# Patient Record
Sex: Male | Born: 1960 | Race: White | Hispanic: No | Marital: Married | Smoking: Former smoker
Health system: Southern US, Community
[De-identification: ages and names within clinical notes are randomized; demographics above are authoritative.]

## PROBLEM LIST (undated history)

## (undated) DIAGNOSIS — I4892 Unspecified atrial flutter: Secondary | ICD-10-CM

## (undated) HISTORY — PX: SHOULDER SURGERY: SHX246

## (undated) HISTORY — DX: Unspecified atrial flutter: I48.92

## (undated) HISTORY — DX: Morbid (severe) obesity due to excess calories: E66.01

## (undated) HISTORY — PX: KNEE SURGERY: SHX244

## (undated) HISTORY — PX: TONSILLECTOMY: SUR1361

---

## 2006-02-14 ENCOUNTER — Encounter: Admission: RE | Admit: 2006-02-14 | Discharge: 2006-02-14 | Payer: Self-pay | Admitting: Rheumatology

## 2007-10-03 ENCOUNTER — Emergency Department (HOSPITAL_BASED_OUTPATIENT_CLINIC_OR_DEPARTMENT_OTHER): Admission: EM | Admit: 2007-10-03 | Discharge: 2007-10-03 | Payer: Self-pay | Admitting: Emergency Medicine

## 2015-02-26 DIAGNOSIS — I4892 Unspecified atrial flutter: Secondary | ICD-10-CM

## 2015-02-26 HISTORY — DX: Unspecified atrial flutter: I48.92

## 2015-09-18 ENCOUNTER — Emergency Department (HOSPITAL_COMMUNITY): Payer: BC Managed Care – PPO

## 2015-09-18 ENCOUNTER — Other Ambulatory Visit: Payer: Self-pay

## 2015-09-18 ENCOUNTER — Encounter (HOSPITAL_COMMUNITY): Payer: Self-pay

## 2015-09-18 ENCOUNTER — Emergency Department (HOSPITAL_COMMUNITY)
Admission: EM | Admit: 2015-09-18 | Discharge: 2015-09-18 | Disposition: A | Discharge status: Home / Self Care | Payer: BC Managed Care – PPO | Attending: Emergency Medicine | Admitting: Emergency Medicine

## 2015-09-18 DIAGNOSIS — Z7901 Long term (current) use of anticoagulants: Secondary | ICD-10-CM | POA: Insufficient documentation

## 2015-09-18 DIAGNOSIS — Z87891 Personal history of nicotine dependence: Secondary | ICD-10-CM | POA: Diagnosis not present

## 2015-09-18 DIAGNOSIS — R009 Unspecified abnormalities of heart beat: Secondary | ICD-10-CM | POA: Diagnosis present

## 2015-09-18 DIAGNOSIS — I4891 Unspecified atrial fibrillation: Secondary | ICD-10-CM | POA: Diagnosis not present

## 2015-09-18 LAB — CBC WITH DIFFERENTIAL/PLATELET
BASOS ABS: 0.1 10*3/uL (ref 0.0–0.1)
BASOS PCT: 1 %
Eosinophils Absolute: 0.2 10*3/uL (ref 0.0–0.7)
Eosinophils Relative: 3 %
HEMATOCRIT: 51.7 % (ref 39.0–52.0)
Hemoglobin: 17.8 g/dL — ABNORMAL HIGH (ref 13.0–17.0)
LYMPHS PCT: 23 %
Lymphs Abs: 2.2 10*3/uL (ref 0.7–4.0)
MCH: 32.6 pg (ref 26.0–34.0)
MCHC: 34.4 g/dL (ref 30.0–36.0)
MCV: 94.7 fL (ref 78.0–100.0)
Monocytes Absolute: 1 10*3/uL (ref 0.1–1.0)
Monocytes Relative: 11 %
NEUTROS ABS: 5.8 10*3/uL (ref 1.7–7.7)
NEUTROS PCT: 62 %
Platelets: 252 10*3/uL (ref 150–400)
RBC: 5.46 MIL/uL (ref 4.22–5.81)
RDW: 13.5 % (ref 11.5–15.5)
WBC: 9.3 10*3/uL (ref 4.0–10.5)

## 2015-09-18 LAB — BASIC METABOLIC PANEL
ANION GAP: 6 (ref 5–15)
BUN: 15 mg/dL (ref 6–20)
CALCIUM: 8.7 mg/dL — AB (ref 8.9–10.3)
CO2: 23 mmol/L (ref 22–32)
Chloride: 110 mmol/L (ref 101–111)
Creatinine, Ser: 1.11 mg/dL (ref 0.61–1.24)
GLUCOSE: 86 mg/dL (ref 65–99)
POTASSIUM: 3.9 mmol/L (ref 3.5–5.1)
SODIUM: 139 mmol/L (ref 135–145)

## 2015-09-18 LAB — BRAIN NATRIURETIC PEPTIDE: B Natriuretic Peptide: 100.2 pg/mL — ABNORMAL HIGH (ref 0.0–100.0)

## 2015-09-18 LAB — I-STAT TROPONIN, ED: TROPONIN I, POC: 0 ng/mL (ref 0.00–0.08)

## 2015-09-18 MED ORDER — RIVAROXABAN 20 MG PO TABS: 20.0000 mg | ORAL_TABLET | Freq: Every day | ORAL | 0 refills | Status: DC

## 2015-09-18 MED ORDER — METOPROLOL SUCCINATE ER 25 MG PO TB24: 25.0000 mg | ORAL_TABLET | Freq: Every day | ORAL | 0 refills | Status: DC

## 2015-09-18 MED ORDER — DILTIAZEM HCL 25 MG/5ML IV SOLN
10.0000 mg | Freq: Once | INTRAVENOUS | Status: AC
  Administered 2015-09-18: 10 mg via INTRAVENOUS
  Filled 2015-09-18: qty 5

## 2015-09-18 MED ORDER — METOPROLOL SUCCINATE ER 25 MG PO TB24
25.0000 mg | ORAL_TABLET | Freq: Every day | ORAL | Status: DC
  Administered 2015-09-18: 25 mg via ORAL
  Filled 2015-09-18: qty 1

## 2015-09-18 MED ORDER — RIVAROXABAN 15 MG PO TABS
15.0000 mg | ORAL_TABLET | Freq: Two times a day (BID) | ORAL | Status: DC
  Filled 2015-09-18 (×2): qty 1

## 2015-09-18 MED ORDER — RIVAROXABAN 20 MG PO TABS
20.0000 mg | ORAL_TABLET | Freq: Once | ORAL | Status: AC
  Administered 2015-09-18: 20 mg via ORAL
  Filled 2015-09-18: qty 1

## 2015-09-18 NOTE — ED Provider Notes (Signed)
[facility]-EMERGENCY DEPT Provider Note   CSN: [id number] Arrival date & time: 09/18/15  1735  First Provider Contact:  None       History   Chief Complaint Chief Complaint  Patient presents with  . Irregular Heart Beat  . Chest Pain    HPI Marvin Simmons is a 55 y.o. male.  55 year old male who presents with heart palpitations. The patient reports that for the past 1-2 months, he has had intermittent heart palpitations that usually occur in the evening when he is trying to go to sleep. He assumed that it was related to anxiety. The episodes seem to be increasing in frequency. Today around 12:00, he didn't having heart palpitations and they were sustained. He looked at his apple watch which showed that his heart rate had been running in the 160s. He contacted his PCP and was sent here by EMS. He denies any associated chest pain, shortness of breath, nausea, or vomiting. He has had some throat tightness and diaphoresis associated with the episodes. No fevers or recent illness. No history of heart problems. He does note a family history of mother with emphysema and atrial fibrillation, recent ablation.   The history is provided by the patient.  Chest Pain      History reviewed. No pertinent past medical history.  There are no active problems to display for this patient.   Past Surgical History:  Procedure Laterality Date  . SHOULDER SURGERY Right   . TONSILLECTOMY         Home Medications    Prior to Admission medications   Medication Sig Start Date End Date Taking? Authorizing Provider  albuterol (PROAIR HFA) 108 (90 Base) MCG/ACT inhaler Inhale 1-2 puffs into the lungs every 6 (six) hours as needed for shortness of breath.  05/24/15  Yes Historical Provider, MD  mometasone (NASONEX) 50 MCG/ACT nasal spray Place 1 spray into the nose daily as needed.   Yes Historical Provider, MD  sildenafil (VIAGRA) 50 MG tablet Take 50 mg by mouth daily as needed.   Yes Historical  Provider, MD  testosterone cypionate (DEPOTESTOSTERONE CYPIONATE) 200 MG/ML injection Inject 200 mg into the muscle every 14 (fourteen) days. 09/16/15  Yes Historical Provider, MD  metoprolol succinate (TOPROL-XL) 25 MG 24 hr tablet Take 1 tablet (25 mg total) by mouth daily. May increase to 2 tablets (  total) by mouth daily if your heart rate continues to be greater than 100 09/18/15   Laurence Spates, MD  rivaroxaban (XARELTO) 20 MG TABS tablet Take 1 tablet (20 mg total) by mouth daily with supper. 09/18/15   Laurence Spates, MD    Family History No family history on file.  Social History Social History  Substance Use Topics  . Smoking status: Former Smoker    Quit date: 09/18/1990  . Smokeless tobacco: Former Neurosurgeon    Quit date: 09/18/1990  . Alcohol use Yes     Comment: avg 4oz alcohol/day     Allergies   Review of patient's allergies indicates no known allergies.   Review of Systems Review of Systems  Cardiovascular: Positive for chest pain.   10 Systems reviewed and are negative for acute change except as noted in the HPI.   Physical Exam Updated Vital Signs BP 118/72   Pulse 106   Temp 98.2 F (36.8 C) (Oral)   Resp 17   SpO2 (!) 75%   Physical Exam  Constitutional: He is oriented to person, place, and time. He appears well-developed  and well-nourished. No distress.  HENT:  Head: Normocephalic and atraumatic.  Moist mucous membranes  Eyes: Conjunctivae are normal. Pupils are equal, round, and reactive to light.  Neck: Neck supple.  Cardiovascular: Normal heart sounds.  An irregularly irregular rhythm present. Tachycardia present.   No murmur heard. Pulmonary/Chest: Effort normal and breath sounds normal.  Abdominal: Soft. Bowel sounds are normal. He exhibits no distension. There is no tenderness.  Musculoskeletal: He exhibits no edema.  Neurological: He is alert and oriented to person, place, and time.  Fluent speech  Skin: Skin is warm and dry.    Psychiatric: He has a normal mood and affect. Judgment normal.  Nursing note and vitals reviewed.    ED Treatments / Results  Labs (all labs ordered are listed, but only abnormal results are displayed) Labs Reviewed  BASIC METABOLIC PANEL - Abnormal; Notable for the following:       Result Value   Calcium 8.7 (*)    All other components within normal limits  CBC WITH DIFFERENTIAL/PLATELET - Abnormal; Notable for the following:    Hemoglobin 17.8 (*)    All other components within normal limits  BRAIN NATRIURETIC PEPTIDE - Abnormal; Notable for the following:    B Natriuretic Peptide 100.2 (*)    All other components within normal limits  I-STAT TROPOININ, ED    EKG  EKG Interpretation  Date/Time:  Monday September 18 2015 18:06:04 EDT Ventricular Rate:  128 PR Interval:    QRS Duration: 80 QT Interval:  342 QTC Calculation: 469 R Axis:   58 Text Interpretation:  Atrial fibrillation Ventricular bigeminy Anteroseptal infarct, age indeterminate ST elevation, consider inferior injury similar to previous EKG A fib with RVR Confirmed by LITTLE MD, RACHEL [phone]) on 09/18/2015 7:04:20 PM       Radiology Dg Chest 2 View  Result Date: 09/18/2015 CLINICAL DATA:  Pt had palpitations starting around 1200 today. Has had similar episodes when trying to go to sleep x 1-2 months. Denies shortness of breath, n/v, dizziness. Does report having tightness in lower throat region EXAM: CHEST  2 VIEW COMPARISON:  02/14/2006 FINDINGS: The heart size and mediastinal contours are within normal limits. Both lungs are clear. No pleural effusion or pneumothorax. The visualized skeletal structures are unremarkable. IMPRESSION: No active cardiopulmonary disease. Electronically Signed   By: Amie Portland M.D.   On: 09/18/2015 19:50   Procedures Procedures (including critical care time)  Medications Ordered in ED Medications  metoprolol succinate (TOPROL-XL) 24 hr tablet 25 mg (25 mg Oral Given 09/18/15  2239)  diltiazem (CARDIZEM) injection 10 mg (10 mg Intravenous Given 09/18/15 1850)  rivaroxaban (XARELTO) tablet 20 mg (20 mg Oral Given 09/18/15 2240)     Initial Impression / Assessment and Plan / ED Course  I have reviewed the triage vital signs and the nursing notes.  Pertinent labs & imaging results that were available during my care of the patient were reviewed by me and considered in my medical decision making (see chart for details).  Clinical Course  Comment By Time  Labs including troponin normal. BNP 100. CXR negative for acute process. Laurence Spates, MD 07/24 2033   Pt with no significant medical history presents with palpitations that began around noon today, patient noted he was tachycardic on his watch heart monitor. On arrival, he was awake and alert, in no acute distress. Blood pressure stable, heart rate variable from 80s to 140s. EKG shows atrial fibrillation/atrial flutter. Patient received aspirin by EMS. Obtained  above lab work as well as chest x-ray. Gave the patient 10 mg IV diltiazem.  Labwork including BNP and troponin was normal. Chest x-ray negative. After receiving single dose of diltiazem, the patient's rate improved to less than 100. I discussed the patient's new diagnosis of atrial fibrillation with cardiology, Dr. Sullivan Lone, who felt that the patient was appropriate for discharge on rate control and anticoagulation. Although his CHADS VASC score is 0, she recommended Xarelto as the patient may be eligible for future cardioversion. Per her recs, I also gave Rx for metoprolol XL 25mg  with structures to increase to 50 mg if he continues to have tachycardia. Patient well-appearing without complaints after several hours in the ED. ED pharmacist reviewed Xarelto instructions with patient. Patient provided with follow-up information to be seen in atrial fibrillation clinic in 48 hours. Return precautions reviewed. Patient voiced understanding and was discharged in  satisfactory condition.  Final Clinical Impressions(s) / ED Diagnoses   Final diagnoses:  Atrial fibrillation with RVR [facility])    New Prescriptions Discharge Medication List as of 09/18/2015 10:29 PM    START taking these medications   Details  metoprolol succinate (TOPROL-XL) 25 MG 24 hr tablet Take 1 tablet (25 mg total) by mouth daily. May increase to 2 tablets (50mg  total) by mouth daily if your heart rate continues to be greater than 100, Starting Mon 09/18/2015, Print    rivaroxaban (XARELTO) 20 MG TABS tablet Take 1 tablet (20 mg total) by mouth daily with supper., Starting Mon 09/18/2015, Print         Laurence Spates, MD 09/19/15 0111

## 2015-09-18 NOTE — Discharge Instructions (Addendum)

## 2015-09-18 NOTE — ED Notes (Signed)
Pt to xray

## 2015-09-18 NOTE — ED Notes (Signed)
Pt. Pulse in 90's and pressure 121/81. MD notified, cardizem administered

## 2015-09-18 NOTE — ED Triage Notes (Signed)
Per EMS - pt from PCP. Pt had palpitations starting around 1200 today. Has had similar episodes when trying to go to sleep x 1-2 months. SBP 160, 96% RA, hr 80-140bpm. Denies shortness of breath, n/v, dizziness. Does report having tightness in lower throat region. Given 324mg  aspirin.

## 2015-09-19 ENCOUNTER — Other Ambulatory Visit: Payer: Self-pay

## 2015-09-19 ENCOUNTER — Encounter (HOSPITAL_COMMUNITY): Payer: Self-pay | Admitting: Nurse Practitioner

## 2015-09-19 ENCOUNTER — Ambulatory Visit (HOSPITAL_COMMUNITY)
Admission: RE | Admit: 2015-09-19 | Discharge: 2015-09-19 | Disposition: A | Discharge status: Home / Self Care | Payer: BC Managed Care – PPO | Source: Ambulatory Visit | Attending: Nurse Practitioner | Admitting: Nurse Practitioner

## 2015-09-19 VITALS — BP 132/76 | HR 134 | Ht 66.75 in | Wt 236.0 lb

## 2015-09-19 DIAGNOSIS — Z79899 Other long term (current) drug therapy: Secondary | ICD-10-CM | POA: Insufficient documentation

## 2015-09-19 DIAGNOSIS — I4891 Unspecified atrial fibrillation: Secondary | ICD-10-CM | POA: Insufficient documentation

## 2015-09-19 DIAGNOSIS — I483 Typical atrial flutter: Secondary | ICD-10-CM

## 2015-09-19 DIAGNOSIS — Z87891 Personal history of nicotine dependence: Secondary | ICD-10-CM | POA: Insufficient documentation

## 2015-09-19 DIAGNOSIS — Z7901 Long term (current) use of anticoagulants: Secondary | ICD-10-CM | POA: Diagnosis not present

## 2015-09-19 DIAGNOSIS — I4892 Unspecified atrial flutter: Secondary | ICD-10-CM | POA: Diagnosis not present

## 2015-09-19 DIAGNOSIS — R0683 Snoring: Secondary | ICD-10-CM | POA: Diagnosis not present

## 2015-09-19 MED ORDER — METOPROLOL SUCCINATE ER 25 MG PO TB24: 50.0000 mg | ORAL_TABLET | Freq: Every day | ORAL | 0 refills | Status: DC

## 2015-09-19 NOTE — Patient Instructions (Signed)
Your physician has recommended you make the following change in your medication:  1)Metoprolol 50mg once a day  

## 2015-09-20 ENCOUNTER — Encounter (HOSPITAL_COMMUNITY): Payer: Self-pay | Admitting: Nurse Practitioner

## 2015-09-20 NOTE — Progress Notes (Signed)
Patient ID: Marvin Simmons, male   DOB: 1960-09-05, 55 y.o.   MRN: [id number]     Primary Care Physician: Delorse Lek, MD Referring Physician: [facility] ER f/u   Marvin Simmons is a 55 y.o. male with h/o new onset a flutter seen in the ER last pm. He was given 25 mg toprol XL daily( has had only the one dose last pm), and was started on xarelto 20 mg with a chadsvasc score of 0. Pt continues in afib with RVR today. Tolerating fairly well.  Review of risk factors include 2 alcoholic drinks per night, probable sleep apnea, with heavy snoring reported by wife, obesity, inactivity. No tobacco and mimial caffeine.No previous echo. 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 H/o recent ablation in his 50+ yo mother in the [place] area.No other family members with afib. Denies rheumatic fever.  Today, he denies symptoms of  chest pain, shortness of breath, orthopnea, PND, lower extremity edema, dizziness, presyncope, syncope, or neurologic sequela. Positive for palps and fatigue.The patient is tolerating medications without difficulties and is otherwise without complaint today.   No past medical history on file. Past Surgical History:  Procedure Laterality Date  . SHOULDER SURGERY Right   . TONSILLECTOMY      Current Outpatient Prescriptions  Medication Sig Dispense Refill  . albuterol (PROAIR HFA) 108 (90 Base) MCG/ACT inhaler Inhale 1-2 puffs into the lungs every 6 (six) hours as needed for shortness of breath.     . metoprolol succinate (TOPROL-XL) 25 MG 24 hr tablet Take 2 tablets (50 mg total) by mouth daily. 30 tablet 0  . mometasone (NASONEX) 50 MCG/ACT nasal spray Place 1 spray into the nose daily as needed.    . rivaroxaban (XARELTO) 20 MG TABS tablet Take 1 tablet (20 mg total) by mouth daily with supper. 30 tablet 0  . sildenafil (VIAGRA) 50 MG tablet Take 50 mg by mouth daily as needed.    . testosterone cypionate (DEPOTESTOSTERONE CYPIONATE) 200 MG/ML injection  Inject 200 mg into the muscle every 14 (fourteen) days.     No current facility-administered medications for this encounter.     No Known Allergies  Social History   Social History  . Marital status: Married    Spouse name: N/A  . Number of children: N/A  . Years of education: N/A   Occupational History  . Not on file.   Social History Main Topics  . Smoking status: Former Smoker    Quit date: 09/18/1990  . Smokeless tobacco: Former Neurosurgeon    Quit date: 09/18/1990  . Alcohol use Yes     Comment: avg 4oz alcohol/day  . Drug use: No  . Sexual activity: Not on file   Other Topics Concern  . Not on file   Social History Narrative  . No narrative on file    No family history on file.  ROS- All systems are reviewed and negative except as per the HPI above  Physical Exam: Vitals:   09/19/15 1445  BP: 132/76  Pulse: (!) 134  Weight: 236 lb (107 kg)  Height: 5' 6.75" (1.695 m)    GEN- The patient is well appearing, alert and oriented x 3 today.   Head- normocephalic, atraumatic Eyes-  Sclera clear, conjunctiva pink Ears- hearing intact Oropharynx- clear Neck- supple, no JVP Lymph- no cervical lymphadenopathy Lungs- Clear to ausculation bilaterally, normal work of breathing Heart- irregular rate and rhythm, no murmurs, rubs or gallops,  PMI not laterally displaced GI- soft, NT, ND, + BS Extremities- no clubbing, cyanosis, or edema MS- no significant deformity or atrophy Skin- no rash or lesion Psych- euthymic mood, full affect Neuro- strength and sensation are intact  EKG-aflutter with v rate at 134 bpm with variable AV block ER/Epic records reviewed  Assessment and Plan: 1. New onset a flutter with RVR Increase toprol xl to 50 mg this pm Continue xarelto 20 mg for now, but with chadsvasc score of 0 probably wont require long term. Echo when v rate controlled If EKG's continue to show only aflutter, may be an aflutter ablation candidate  2. Risk factor  modification Decrease alcohol to no more that 2 drinks a week Weight lose thru increased physical activity and diet modification Weight class encouraged Sleep study for snoring and many symptoms of sleep apnea  F/u Thursday in afib clinic   Marypat Kimmet [facility] [address] [place], [place] [address] [phone]

## 2015-09-21 ENCOUNTER — Other Ambulatory Visit: Payer: Self-pay

## 2015-09-21 ENCOUNTER — Encounter (HOSPITAL_COMMUNITY): Payer: Self-pay | Admitting: Nurse Practitioner

## 2015-09-21 ENCOUNTER — Ambulatory Visit (HOSPITAL_COMMUNITY)
Admission: RE | Admit: 2015-09-21 | Discharge: 2015-09-21 | Disposition: A | Discharge status: Home / Self Care | Payer: BC Managed Care – PPO | Source: Ambulatory Visit | Attending: Nurse Practitioner | Admitting: Nurse Practitioner

## 2015-09-21 VITALS — BP 106/68 | HR 110 | Ht 66.75 in | Wt 236.2 lb

## 2015-09-21 DIAGNOSIS — Z87891 Personal history of nicotine dependence: Secondary | ICD-10-CM | POA: Insufficient documentation

## 2015-09-21 DIAGNOSIS — F1010 Alcohol abuse, uncomplicated: Secondary | ICD-10-CM | POA: Insufficient documentation

## 2015-09-21 DIAGNOSIS — R9431 Abnormal electrocardiogram [ECG] [EKG]: Secondary | ICD-10-CM | POA: Diagnosis not present

## 2015-09-21 DIAGNOSIS — I483 Typical atrial flutter: Secondary | ICD-10-CM

## 2015-09-21 DIAGNOSIS — I4892 Unspecified atrial flutter: Secondary | ICD-10-CM | POA: Diagnosis not present

## 2015-09-21 DIAGNOSIS — Z7901 Long term (current) use of anticoagulants: Secondary | ICD-10-CM | POA: Insufficient documentation

## 2015-09-21 NOTE — Progress Notes (Signed)
Patient ID: Marvin Simmons, male   DOB: 1960/06/04, 55 y.o.   MRN: [id number]       Primary Care Physician: Delorse Lek, MD Referring Physician: [facility] ER f/u   Marvin Simmons is a 55 y.o. male with h/o new onset a flutter seen in the ER last pm. He was given 25 mg toprol XL daily( has had only the one dose last pm), and was started on xarelto 20 mg with a chadsvasc score of 0. Pt continues in aflutter with RVR today. Tolerating fairly well.  Review of risk factors include 2 alcoholic drinks per night, probable sleep apnea, with heavy snoring reported by wife, obesity, inactivity. No tobacco and mimial caffeine.No previous echo. 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 H/o recent ablation in his 71+ yo mother in the [place] area.No other family members with atrial arrhythmias. Denies rheumatic fever.    Returns today with EKG showing aflutter with HR slower at 110 bpm, but BP soft  With sys around 100 and do not think that I can go up on rate control meds today. He is tolerating arrhythmia ok. He is being compliant with xarleto.  Today, he denies symptoms of  chest pain, shortness of breath, orthopnea, PND, lower extremity edema, dizziness, presyncope, syncope, or neurologic sequela. Positive for palps and fatigue.The patient is tolerating medications without difficulties and is otherwise without complaint today.   No past medical history on file. Past Surgical History:  Procedure Laterality Date  . SHOULDER SURGERY Right   . TONSILLECTOMY      Current Outpatient Prescriptions  Medication Sig Dispense Refill  . albuterol (PROAIR HFA) 108 (90 Base) MCG/ACT inhaler Inhale 1-2 puffs into the lungs every 6 (six) hours as needed for shortness of breath.     . metoprolol succinate (TOPROL-XL) 25 MG 24 hr tablet Take 2 tablets (50 mg total) by mouth daily. 30 tablet 0  . mometasone (NASONEX) 50 MCG/ACT nasal spray Place 1 spray into the nose daily as needed.    .  rivaroxaban (XARELTO) 20 MG TABS tablet Take 1 tablet (20 mg total) by mouth daily with supper. 30 tablet 0  . sildenafil (VIAGRA) 50 MG tablet Take 50 mg by mouth daily as needed.    . testosterone cypionate (DEPOTESTOSTERONE CYPIONATE) 200 MG/ML injection Inject 200 mg into the muscle every 14 (fourteen) days.     No current facility-administered medications for this encounter.     No Known Allergies  Social History   Social History  . Marital status: Married    Spouse name: N/A  . Number of children: N/A  . Years of education: N/A   Occupational History  . Not on file.   Social History Main Topics  . Smoking status: Former Smoker    Quit date: 09/18/1990  . Smokeless tobacco: Former Neurosurgeon    Quit date: 09/18/1990  . Alcohol use Yes     Comment: avg 4oz alcohol/day  . Drug use: No  . Sexual activity: Not on file   Other Topics Concern  . Not on file   Social History Narrative  . No narrative on file    No family history on file.  ROS- All systems are reviewed and negative except as per the HPI above  Physical Exam: Vitals:   09/21/15 1542  BP: 106/68  Pulse: (!) 110  Weight: 236 lb 3.2 oz (107.1 kg)  Height: 5' 6.75" (1.695 m)    GEN- The  patient is well appearing, alert and oriented x 3 today.   Head- normocephalic, atraumatic Eyes-  Sclera clear, conjunctiva pink Ears- hearing intact Oropharynx- clear Neck- supple, no JVP Lymph- no cervical lymphadenopathy Lungs- Clear to ausculation bilaterally, normal work of breathing Heart- irregular rate and rhythm, no murmurs, rubs or gallops, PMI not laterally displaced GI- soft, NT, ND, + BS Extremities- no clubbing, cyanosis, or edema MS- no significant deformity or atrophy Skin- no rash or lesion Psych- euthymic mood, full affect Neuro- strength and sensation are intact  EKG-aflutter with v rate at 110 bpm with variable AV block, qrs int at 80 ms, qtc 435 ms ER/Epic records reviewed  Assessment and  Plan: 1. New onset a flutter with RVR Continue toprol xl  50 mg daily Continue xarelto 20 mg for now, until treatment can be determined,but with chadsvasc score of 0 probably won't require long term. Echo when v rate controlled If EKG's continue to show only aflutter, may be an aflutter ablation candidate  2. Risk factor modification Decrease alcohol to no more that 2 drinks a week Weight lose thru increased physical activity and diet modification Weight class encouraged Sleep study for snoring and many symptoms of sleep apnea  F/u next Tuesday in afib clinic   Nikiyah Fackler [facility] [address] [place], [place] [address] [phone]

## 2015-09-26 ENCOUNTER — Ambulatory Visit (HOSPITAL_COMMUNITY)
Admission: RE | Admit: 2015-09-26 | Discharge: 2015-09-26 | Disposition: A | Discharge status: Home / Self Care | Payer: BC Managed Care – PPO | Source: Ambulatory Visit | Attending: Nurse Practitioner | Admitting: Nurse Practitioner

## 2015-09-26 DIAGNOSIS — I4891 Unspecified atrial fibrillation: Secondary | ICD-10-CM | POA: Diagnosis present

## 2015-09-26 DIAGNOSIS — I480 Paroxysmal atrial fibrillation: Secondary | ICD-10-CM

## 2015-09-26 DIAGNOSIS — I4892 Unspecified atrial flutter: Secondary | ICD-10-CM | POA: Diagnosis not present

## 2015-09-26 NOTE — Progress Notes (Addendum)
Pt in for repeat EKG after increasing metoprolol. Rudi Coco NP to review.  Pt converted on his on sometime in the last couple of days, on metoprolol 50 mg daily and xarelto 20 mg a day. He did not notice a huge change in the way he felt but wife has noticed difference in the last few days. He had been asked to diminish alcohol use and is pending echo in one week and sleep study. Will detertmine f/u when echo is reviewed.

## 2015-10-02 ENCOUNTER — Ambulatory Visit (HOSPITAL_COMMUNITY)
Admission: RE | Admit: 2015-10-02 | Discharge: 2015-10-02 | Disposition: A | Discharge status: Home / Self Care | Payer: BC Managed Care – PPO | Source: Ambulatory Visit | Attending: Nurse Practitioner | Admitting: Nurse Practitioner

## 2015-10-02 DIAGNOSIS — I517 Cardiomegaly: Secondary | ICD-10-CM | POA: Diagnosis not present

## 2015-10-02 DIAGNOSIS — I4891 Unspecified atrial fibrillation: Secondary | ICD-10-CM | POA: Diagnosis not present

## 2015-10-02 DIAGNOSIS — Z87891 Personal history of nicotine dependence: Secondary | ICD-10-CM | POA: Diagnosis not present

## 2015-10-02 DIAGNOSIS — I071 Rheumatic tricuspid insufficiency: Secondary | ICD-10-CM | POA: Insufficient documentation

## 2015-10-02 NOTE — Progress Notes (Signed)
  Echocardiogram 2D Echocardiogram has been performed.  Nolon RodBrown, Tony 10/02/2015, 1:34 PM

## 2015-10-04 ENCOUNTER — Telehealth (HOSPITAL_COMMUNITY): Payer: Self-pay | Admitting: *Deleted

## 2015-10-04 ENCOUNTER — Other Ambulatory Visit (HOSPITAL_COMMUNITY): Payer: Self-pay | Admitting: *Deleted

## 2015-10-04 ENCOUNTER — Other Ambulatory Visit: Payer: Self-pay

## 2015-10-04 DIAGNOSIS — R0683 Snoring: Secondary | ICD-10-CM

## 2015-10-04 MED ORDER — METOPROLOL SUCCINATE ER 25 MG PO TB24: 50.0000 mg | ORAL_TABLET | Freq: Every day | ORAL | 3 refills | Status: DC

## 2015-10-04 NOTE — Telephone Encounter (Signed)
Pt notified of echo results. Pt very frustrated on phone stating if his echo was normal did he need to continue Xarelto and metoprolol and what explains why this happened to him. Reiterated how important lifestyle is with afib/flutter (weight loss, sleep apnea being treated, and alcohol use decreased) and this may have all been contributing factors to his aflutter. Patient was told with CHADVASC score of 0 he could stop the Xarelto after 30 days but should continue the metoprolol 50mg  daily to prevent further breakthrough. Pt stated he did not want to be on metoprolol the rest of his life. Pt was instructed to follow up with Rudi Cocoonna Carroll NP in 3 weeks to re-evaluate HR and decisions regarding metoprolol could be determined at that time. Lupita LeashDonna also recommended becoming established with a general cardiologist with the recent new onset of aflutter - pt also hesitate to do this since his echo is normal. Pt states he will call back for appt once he has his calendar.

## 2015-10-06 ENCOUNTER — Encounter (HOSPITAL_COMMUNITY): Payer: Self-pay | Admitting: *Deleted

## 2015-10-20 ENCOUNTER — Ambulatory Visit (HOSPITAL_COMMUNITY)
Admission: RE | Admit: 2015-10-20 | Discharge: 2015-10-20 | Disposition: A | Discharge status: Home / Self Care | Payer: BC Managed Care – PPO | Source: Ambulatory Visit | Attending: Nurse Practitioner | Admitting: Nurse Practitioner

## 2015-10-20 ENCOUNTER — Encounter (HOSPITAL_COMMUNITY): Payer: Self-pay | Admitting: Nurse Practitioner

## 2015-10-20 VITALS — BP 126/76 | HR 78 | Ht 66.75 in | Wt 236.6 lb

## 2015-10-20 DIAGNOSIS — E669 Obesity, unspecified: Secondary | ICD-10-CM | POA: Insufficient documentation

## 2015-10-20 DIAGNOSIS — Z7901 Long term (current) use of anticoagulants: Secondary | ICD-10-CM | POA: Diagnosis not present

## 2015-10-20 DIAGNOSIS — Z87891 Personal history of nicotine dependence: Secondary | ICD-10-CM | POA: Insufficient documentation

## 2015-10-20 DIAGNOSIS — I483 Typical atrial flutter: Secondary | ICD-10-CM | POA: Diagnosis not present

## 2015-10-20 DIAGNOSIS — I4891 Unspecified atrial fibrillation: Secondary | ICD-10-CM | POA: Diagnosis present

## 2015-10-20 DIAGNOSIS — Z6837 Body mass index (BMI) 37.0-37.9, adult: Secondary | ICD-10-CM | POA: Insufficient documentation

## 2015-10-20 DIAGNOSIS — I4892 Unspecified atrial flutter: Secondary | ICD-10-CM | POA: Insufficient documentation

## 2015-10-20 DIAGNOSIS — R0683 Snoring: Secondary | ICD-10-CM | POA: Insufficient documentation

## 2015-10-20 DIAGNOSIS — Z79899 Other long term (current) drug therapy: Secondary | ICD-10-CM | POA: Diagnosis not present

## 2015-10-20 MED ORDER — METOPROLOL SUCCINATE ER 25 MG PO TB24: 25.0000 mg | ORAL_TABLET | Freq: Every day | ORAL | 3 refills | Status: DC

## 2015-10-20 NOTE — Progress Notes (Signed)
Patient ID: Marvin Simmons, male   DOB: 01/20/1961, 55 y.o.   MRN: [id number]       Primary Care Physician: Delorse Lek, MD Referring Physician: [facility] ER f/u   Marvin Simmons is a 55 y.o. male with h/o new onset a flutter seen in the ER last pm. He was given 25 mg toprol XL daily( has had only the one dose last pm), and was started on xarelto 20 mg with a chadsvasc score of 0. Pt continues in aflutter with RVR today. Tolerating fairly well.  Review of risk factors include 2 alcoholic drinks per night, probable sleep apnea, with heavy snoring reported by wife, obesity, inactivity. No tobacco and mimial caffeine.No previous echo. 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 H/o recent ablation in his 33+ yo mother in the [place] area.No other family members with atrial arrhythmias. Denies rheumatic fever.    Returns today with EKG showing aflutter with HR slower at 110 bpm, but BP soft  With sys around 100 and do not think that I can go up on rate control meds today. He is tolerating arrhythmia ok. He is being compliant with xarleto.  He returned 7/27 and was in SR.  F/u in afib clinic 8/9 and continues in SR. No further issues with a flutter. Can stop DOAC with chadsvasc score of 0 and would like to reduce metoprolol to 25 mg once a day for a while and then stop drug . He is pending sleep study and if further a flutter will be referred for possibly a flutter ablation. Results of echo discussed.  Today, he denies symptoms of  chest pain, shortness of breath, orthopnea, PND, lower extremity edema, dizziness, presyncope, syncope, or neurologic sequela. Positive for palps and fatigue.The patient is tolerating medications without difficulties and is otherwise without complaint today.   No past medical history on file. Past Surgical History:  Procedure Laterality Date  . SHOULDER SURGERY Right   . TONSILLECTOMY      Current Outpatient Prescriptions  Medication Sig  Dispense Refill  . albuterol (PROAIR HFA) 108 (90 Base) MCG/ACT inhaler Inhale 1-2 puffs into the lungs every 6 (six) hours as needed for shortness of breath.     . metoprolol succinate (TOPROL-XL) 25 MG 24 hr tablet Take 1 tablet (25 mg total) by mouth daily. 30 tablet 3  . mometasone (NASONEX) 50 MCG/ACT nasal spray Place 1 spray into the nose daily as needed.    . rivaroxaban (XARELTO) 20 MG TABS tablet Take 1 tablet (20 mg total) by mouth daily with supper. 30 tablet 0  . sildenafil (VIAGRA) 50 MG tablet Take 50 mg by mouth daily as needed.    . testosterone cypionate (DEPOTESTOSTERONE CYPIONATE) 200 MG/ML injection Inject 200 mg into the muscle every 14 (fourteen) days.     No current facility-administered medications for this encounter.     No Known Allergies  Social History   Social History  . Marital status: Married    Spouse name: N/A  . Number of children: N/A  . Years of education: N/A   Occupational History  . Not on file.   Social History Main Topics  . Smoking status: Former Smoker    Quit date: 09/18/1990  . Smokeless tobacco: Former Neurosurgeon    Quit date: 09/18/1990  . Alcohol use Yes     Comment: avg 4oz alcohol/day  . Drug use: No  . Sexual activity: Not on file   Other  Topics Concern  . Not on file   Social History Narrative  . No narrative on file    No family history on file.  ROS- All systems are reviewed and negative except as per the HPI above  Physical Exam: Vitals:   10/20/15 1120  BP: 126/76  Pulse: 78  Weight: 236 lb 9.6 oz (107.3 kg)  Height: 5' 6.75" (1.695 m)    GEN- The patient is well appearing, alert and oriented x 3 today.   Head- normocephalic, atraumatic Eyes-  Sclera clear, conjunctiva pink Ears- hearing intact Oropharynx- clear Neck- supple, no JVP Lymph- no cervical lymphadenopathy Lungs- Clear to ausculation bilaterally, normal work of breathing Heart- regular rate and rhythm, no murmurs, rubs or gallops, PMI not  laterally displaced GI- soft, NT, ND, + BS Extremities- no clubbing, cyanosis, or edema MS- no significant deformity or atrophy Skin- no rash or lesion Psych- euthymic mood, full affect Neuro- strength and sensation are intact  EKG-Sinus brady with v rate at 58 bpm, pr int 126 ma, qrs int 84 ms, qtc 482 ms Epic records reviewed Echo-Left ventricle: The cavity size was normal. There was mild focal   basal hypertrophy of the septum. Systolic function was normal.   The estimated ejection fraction was in the range of 55% to 60%.   Wall motion was normal; there were no regional wall motion   abnormalities. Left ventricular diastolic function parameters   were normal. - Aortic valve: Transvalvular velocity was within the normal range.   There was no stenosis. There was no regurgitation. - Mitral valve: Transvalvular velocity was within the normal range.   There was no evidence for stenosis. There was no regurgitation. - Right ventricle: The cavity size was normal. Wall thickness was   normal. Systolic function was normal. - Tricuspid valve: There was physiologic regurgitation. - Pulmonary arteries: Systolic pressure was within the normal   range. PA peak pressure: 26 mm Hg (S).  Assessment and Plan: 1. New onset a flutter with RVR, resolved Continue toprol at 1/2 dose for several weeks and if does well can stop Stop xarelto 20 mg when finishes 30 day rx, with chadsvasc score of 0 If  typical aflutter returns, will refer for a flutter ablation  If EKG's continue to show only aflutter, may be an aflutter ablation candidate  2. Risk factor modification Decrease alcohol to no more that 2 drinks a week Weight lose thru increased physical activity and diet modification Weight class encouraged Sleep study for snoring and many symptoms of sleep apnea  F/u afib clinic as needed   Lupita LeashDonna C. Matthew Folksarroll, ANP-C [facility] [address] [place], [place]  [id number] [phone]

## 2015-11-19 ENCOUNTER — Ambulatory Visit (HOSPITAL_BASED_OUTPATIENT_CLINIC_OR_DEPARTMENT_OTHER): Payer: BC Managed Care – PPO | Attending: Nurse Practitioner | Admitting: Cardiology

## 2015-11-19 VITALS — Ht 67.0 in | Wt 230.0 lb

## 2015-11-19 DIAGNOSIS — I4892 Unspecified atrial flutter: Secondary | ICD-10-CM | POA: Insufficient documentation

## 2015-11-19 DIAGNOSIS — G4761 Periodic limb movement disorder: Secondary | ICD-10-CM | POA: Insufficient documentation

## 2015-11-19 DIAGNOSIS — I483 Typical atrial flutter: Secondary | ICD-10-CM | POA: Diagnosis not present

## 2015-11-19 DIAGNOSIS — R0683 Snoring: Secondary | ICD-10-CM

## 2015-11-27 NOTE — Procedures (Signed)
   Patient Name: Marvin Simmons, [name] Date: 11/19/2015 Gender: Male D.O.B: [birth year] Age (years): 54 Referring Provider: Newman Niponna C Carroll Height (inches): 67 Interpreting Physician: Armanda Magicraci Ellanore Vanhook MD, ABSM Weight (lbs): 230 RPSGT: Rolene ArbourMcConnico, Yvonne BMI: 36 MRN: [id number] Neck Size: 18.50  CLINICAL INFORMATION Sleep Study Type: NPSG Indication for sleep study: Snoring Epworth Sleepiness Score: 9  SLEEP STUDY TECHNIQUE As per the AASM Manual for the Scoring of Sleep and Associated Events v2.3 (April 2016) with a hypopnea requiring 4% desaturations. The channels recorded and monitored were frontal, central and occipital EEG, electrooculogram (EOG), submentalis EMG (chin), nasal and oral airflow, thoracic and abdominal wall motion, anterior tibialis EMG, snore microphone, electrocardiogram, and pulse oximetry.  MEDICATIONS Patient's medications include: NASONEX. Medications self-administered by patient during sleep study : No sleep medicine administered.  SLEEP ARCHITECTURE The study was initiated at 10:55:52 PM and ended at 4:57:22 AM. Sleep onset time was 56.3 minutes and the sleep efficiency was 66.3%. The total sleep time was 239.7 minutes. Stage REM latency was 99.5 minutes. The patient spent 6.68% of the night in stage N1 sleep, 85.40% in stage N2 sleep, 0.00% in stage N3 and 7.93% in REM. Alpha intrusion was absent. Supine sleep was 4.63%.  RESPIRATORY PARAMETERS The overall apnea/hypopnea index (AHI) was 3.8 per hour. There were 3 total apneas, including 3 obstructive, 0 central and 0 mixed apneas. There were 12 hypopneas and 93 RERAs. The AHI during Stage REM sleep was 3.2 per hour. AHI while supine was 16.2 per hour. The mean oxygen saturation was 92.41%. The minimum SpO2 during sleep was 84.00%. Loud snoring was noted during this study.  CARDIAC DATA The 2 lead EKG demonstrated atrial flutter. The mean heart rate was 77.26 beats per minute.   LEG MOVEMENT  DATA The total PLMS were 76 with a resulting PLMS index of 19.02. Associated arousal with leg movement index was 5.0 .  IMPRESSIONS - No significant obstructive sleep apnea occurred during this study (AHI = 3.8/h). - No significant central sleep apnea occurred during this study (CAI = 0.0/h). - Mild oxygen desaturation was noted during this study (Min O2 = 84.00%). - The patient snored with Loud snoring volume. - EKG findings include Atrial Flutter. - Mild periodic limb movements of sleep occurred during the study. Associated arousals were significant.  DIAGNOSIS - Atrial Flutter - Snoring  RECOMMENDATIONS - Positional therapy avoiding supine position during sleep. - Avoid alcohol, sedatives and other CNS depressants that may worsen sleep apnea and disrupt normal sleep architecture. - Sleep hygiene should be reviewed to assess factors that may improve sleep quality. - Weight management and regular exercise should be initiated or continued if appropriate.  Armanda Magicraci Hendrix Yurkovich Diplomate, American Board of Sleep Medicine  ELECTRONICALLY SIGNED ON:  11/27/2015, 9:10 PM [facility] PH: [phone]   FX: [phone] ACCREDITED BY THE AMERICAN ACADEMY OF SLEEP MEDICINE

## 2015-11-28 NOTE — Progress Notes (Signed)
Patient informed of information.  Stated verbal understanding  

## 2016-01-14 DIAGNOSIS — Z7901 Long term (current) use of anticoagulants: Secondary | ICD-10-CM | POA: Insufficient documentation

## 2016-01-14 DIAGNOSIS — I4892 Unspecified atrial flutter: Secondary | ICD-10-CM | POA: Insufficient documentation

## 2016-01-15 ENCOUNTER — Encounter: Payer: Self-pay | Admitting: Interventional Cardiology

## 2016-01-15 ENCOUNTER — Encounter (INDEPENDENT_AMBULATORY_CARE_PROVIDER_SITE_OTHER): Payer: Self-pay

## 2016-01-15 ENCOUNTER — Ambulatory Visit (INDEPENDENT_AMBULATORY_CARE_PROVIDER_SITE_OTHER): Payer: BC Managed Care – PPO | Admitting: Interventional Cardiology

## 2016-01-15 VITALS — BP 144/86 | HR 88 | Ht 67.0 in | Wt 245.6 lb

## 2016-01-15 DIAGNOSIS — I483 Typical atrial flutter: Secondary | ICD-10-CM | POA: Diagnosis not present

## 2016-01-15 DIAGNOSIS — Z7901 Long term (current) use of anticoagulants: Secondary | ICD-10-CM

## 2016-01-15 MED ORDER — ASPIRIN EC 81 MG PO TBEC: 81.0000 mg | DELAYED_RELEASE_TABLET | Freq: Every day | ORAL | 3 refills | Status: DC

## 2016-01-15 NOTE — Progress Notes (Signed)
Cardiology Office Note    Date:  01/15/2016   ID:  Marvin Simmons, DOB January 30, 1961, MRN [id number]  PCP:  Delorse Lek, MD  Cardiologist: Lesleigh Noe, MD   Chief Complaint  Patient presents with  . 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  He self diagnosed a cardiac arrhythmia when his digital watch recorded a heart rate of 155 bpm. He saw his physician that day to do an EKG. Atrial flutter was the diagnosis made. He was seen in the A. fib clinic. He was admitted to the hospital. Atrial flutter spontaneously converted. He has had one episode of atrial flutter since discharge from the hospital.  He has had a sleep study and does not have sleep apnea.  He drinks alcohol nearly every day.    No past medical history on file.  Past Surgical History:  Procedure Laterality Date  . SHOULDER SURGERY Right   . TONSILLECTOMY      Current Medications: Outpatient Medications Prior to Visit  Medication Sig Dispense Refill  . albuterol (PROAIR HFA) 108 (90 Base) MCG/ACT inhaler Inhale 1-2 puffs into the lungs every 6 (six) hours as needed for shortness of breath.     . metoprolol succinate (TOPROL-XL) 25 MG 24 hr tablet Take 1 tablet (25 mg total) by mouth daily. 30 tablet 3  . testosterone cypionate (DEPOTESTOSTERONE CYPIONATE) 200 MG/ML injection Inject 200 mg into the muscle every 14 (fourteen) days.    . mometasone (NASONEX) 50 MCG/ACT nasal spray Place 1 spray into the nose daily as needed.    . rivaroxaban (XARELTO) 20 MG TABS tablet Take 1 tablet (20 mg total) by mouth daily with supper. (Patient not taking: Reported on 01/15/2016) 30 tablet 0  . sildenafil (VIAGRA) 50 MG tablet Take 50 mg by mouth daily as needed.     No facility-administered medications prior to visit.      Allergies:   Patient has no known allergies.   Social History    Social History  . Marital status: Married    Spouse name: N/A  . Number of children: N/A  . Years of education: N/A   Social History Main Topics  . Smoking status: Former Smoker    Quit date: 09/18/1990  . Smokeless tobacco: Former Neurosurgeon    Types: Snuff    Quit date: 09/18/1990  . Alcohol use Yes     Comment: avg 4oz alcohol/day  . Drug use: No  . Sexual activity: Not Asked   Other Topics Concern  . None   Social History Narrative  . None     Family History:  The patient's family history includes Emphysema in his mother; Lactose intolerance in his father.   ROS:   Please see the history of present illness.    Weight gain, snoring, wheezing, and cough.  All other systems reviewed and are negative.   PHYSICAL EXAM:   VS:  BP (!) 144/86 (BP Location: Right Arm)   Pulse 88   Ht 5\' 7"  (1.702 m)   Wt 245 lb 9.6 oz (111.4 kg)   BMI 38.47 kg/m    GEN: Well nourished, well developed, in no acute distress . Obese. Bull neck. HEENT: normal  Neck: no JVD, carotid bruits, or masses Cardiac: RRR; no murmurs, rubs, or gallops,no edema  Respiratory:  clear to auscultation bilaterally, normal work of breathing  GI: soft, nontender, nondistended, + BS MS: no deformity or atrophy  Skin: warm and dry, no rash Neuro:  Alert and Oriented x 3, Strength and sensation are intact Psych: euthymic mood, full affect  Wt Readings from Last 3 Encounters:  01/15/16 245 lb 9.6 oz (111.4 kg)  11/19/15 230 lb (104.3 kg)  10/20/15 236 lb 9.6 oz (107.3 kg)      Studies/Labs Reviewed:   EKG:  EKG  Not repeated. Last tracing performed in August revealed left axis deviation, and normal sinus rhythm. Left atrial abnormality was also noted.  Recent Labs: 09/18/2015: B Natriuretic Peptide 100.2; BUN 15; Creatinine, Ser 1.11; Hemoglobin 17.8; Platelets 252; Potassium 3.9; Sodium 139   Lipid Panel No results found for: CHOL, TRIG, HDL, CHOLHDL, VLDL, LDLCALC, LDLDIRECT  Additional studies/  records that were reviewed today include:  Echocardiogram 10/02/15 Study Conclusions  - Left ventricle: The cavity size was normal. There was mild focal   basal hypertrophy of the septum. Systolic function was normal.   The estimated ejection fraction was in the range of 55% to 60%.   Wall motion was normal; there were no regional wall motion   abnormalities. Left ventricular diastolic function parameters   were normal. - Aortic valve: Transvalvular velocity was within the normal range.   There was no stenosis. There was no regurgitation. - Mitral valve: Transvalvular velocity was within the normal range.   There was no evidence for stenosis. There was no regurgitation. - Right ventricle: The cavity size was normal. Wall thickness was   normal. Systolic function was normal. - Tricuspid valve: There was physiologic regurgitation. - Pulmonary arteries: Systolic pressure was within the normal   range. PA peak pressure: 26 mm Hg (S).  Sleep study 11/27/15: IMPRESSIONS - No significant obstructive sleep apnea occurred during this study (AHI = 3.8/h). - No significant central sleep apnea occurred during this study (CAI = 0.0/h). - Mild oxygen desaturation was noted during this study (Min O2 = 84.00%). - The patient snored with Loud snoring volume. - EKG findings include Atrial Flutter. - Mild periodic limb movements of sleep occurred during the study. Associated arousals were significant.  DIAGNOSIS ASSESSMENT:    1. Typical atrial flutter (HCC)   2. Anticoagulation adequate   3. Morbid obesity (HCC)      PLAN:  In order of problems listed above:  1. One recurrent since discharge from the hospital on beta blocker therapy. We discussed risk factor modification including loss, aerobic activity, marked reduction in alcohol intake from daily to as infrequently as possible, and blood pressure control. Exercise should be non-tension development activity. Recommended a marked reduction in  carbohydrate intake.  2. He is discontinued anticoagulation therapy.CHADS VASC is 0-1. Aspirin 81 mg, one per day is recommended. 3. Moderate obesity needs to be treated with low carbohydrate diet, aerobic activity, alcohol cessation, and nutritionist/dietitian consult    Medication Adjustments/Labs and Tests Ordered: Current medicines are reviewed at length with the patient today.  Concerns regarding medicines are outlined above.  Medication changes, Labs and Tests ordered today are listed in the Patient Instructions below. Patient Instructions  Medication Instructions:  1) START Aspirin 81mg  once daily.  Labwork: None  Testing/Procedures: None  Follow-Up: Your physician wants you to follow-up in: 1 year with Dr. Katrinka BlazingSmith. You will receive a reminder letter in the mail two months in advance. If you don't receive a letter, please call our office to schedule the follow-up appointment.   Any Other Special Instructions Will  Be Listed Below (If Applicable).  Your physician discussed the importance of regular exercise and recommended that you start or continue a regular exercise program for good health.  Do not do isometric exercises such as weightlifting.    If you notice your Atrial Flutter more often, please contact our office.     If you need a refill on your cardiac medications before your next appointment, please call your pharmacy.      Signed, Lesleigh NoeHenry W Samantha Olivera III, MD  01/15/2016 5:24 PM    [facility] [address], [place], [place]  [id number] Phone: [phone]; Fax: [phone]

## 2016-01-15 NOTE — Patient Instructions (Addendum)
Medication Instructions:  1) START Aspirin 81mg  once daily.  Labwork: None  Testing/Procedures: None  Follow-Up: Your physician wants you to follow-up in: 1 year with Dr. Katrinka BlazingSmith. You will receive a reminder letter in the mail two months in advance. If you don't receive a letter, please call our office to schedule the follow-up appointment.   Any Other Special Instructions Will Be Listed Below (If Applicable).  Your physician discussed the importance of regular exercise and recommended that you start or continue a regular exercise program for good health.  Do not do isometric exercises such as weightlifting.    If you notice your Atrial Flutter more often, please contact our office.     If you need a refill on your cardiac medications before your next appointment, please call your pharmacy.

## 2016-10-19 ENCOUNTER — Other Ambulatory Visit (HOSPITAL_COMMUNITY): Payer: Self-pay | Admitting: Nurse Practitioner

## 2016-11-18 ENCOUNTER — Telehealth: Payer: Self-pay | Admitting: Endocrinology

## 2016-11-22 NOTE — Telephone Encounter (Signed)
Error

## 2016-12-31 ENCOUNTER — Encounter: Payer: Self-pay | Admitting: Interventional Cardiology

## 2016-12-31 ENCOUNTER — Ambulatory Visit (INDEPENDENT_AMBULATORY_CARE_PROVIDER_SITE_OTHER): Payer: BC Managed Care – PPO | Admitting: Interventional Cardiology

## 2016-12-31 VITALS — BP 142/82 | HR 130 | Ht 67.0 in | Wt 248.8 lb

## 2016-12-31 DIAGNOSIS — I483 Typical atrial flutter: Secondary | ICD-10-CM | POA: Diagnosis not present

## 2016-12-31 HISTORY — DX: Morbid (severe) obesity due to excess calories: E66.01

## 2016-12-31 NOTE — Patient Instructions (Addendum)
Medication Instructions:  Your physician recommends that you continue on your current medications as directed. Please refer to the Current Medication list given to you today.  Labwork: None  Testing/Procedures: Your physician has recommended that you wear a 48 hour holter monitor. Holter monitors are medical devices that record the heart's electrical activity. Doctors most often use these monitors to diagnose arrhythmias. Arrhythmias are problems with the speed or rhythm of the heartbeat. The monitor is a small, portable device. You can wear one while you do your normal daily activities. This is usually used to diagnose what is causing palpitations/syncope (passing out).    Follow-Up: Your physician recommends that you schedule a follow-up appointment in: 1 month with Dr. Katrinka BlazingSmith. (Can have 12/7 or 12/10 at 10:20A)   Any Other Special Instructions Will Be Listed Below (If Applicable).     If you need a refill on your cardiac medications before your next appointment, please call your pharmacy.

## 2016-12-31 NOTE — Progress Notes (Signed)
Cardiology Office Note    Date:  12/31/2016   ID:  Marvin Simmons, DOB 06/15/1960, MRN [id number]  PCP:  Delorse Lek, MD  Cardiologist: Lesleigh Noe, MD   Chief Complaint  Patient presents with  . 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  This occurred in 2017.  He has a history of atrial flutter.  Density of atrial flutter has not been quantitated.  He comes in today feeling well.  He is in atrial flutter with a ventricular response of 130 bpm.  Over the last 6 months he has been monitoring heart rates on his apple watch.  He has heart rates as fast as 150 bpm most days.  Also has heart rates in the 60s every day.  This implies that he is having intermittent atrial flutter.  He denies angina, dyspnea, orthopnea, PND, and episodes of syncope.  Also states that earlier this year he had endoscopy and position was concerned because of rapid heart rates that were present.  He is not very compliant with medical therapy.  Has not had metoprolol in several days.   Past Medical History:  Diagnosis Date  . Atrial flutter (HCC) 2017  . Morbid obesity (HCC) 12/31/2016    Past Surgical History:  Procedure Laterality Date  . SHOULDER SURGERY Right   . TONSILLECTOMY      Current Medications: Outpatient Medications Prior to Visit  Medication Sig Dispense Refill  . albuterol (PROAIR HFA) 108 (90 Base) MCG/ACT inhaler Inhale 1-2 puffs into the lungs every 6 (six) hours as needed for shortness of breath.     Marland Kitchen aspirin EC 81 MG tablet Take 1 tablet (81 mg total) by mouth daily. 90 tablet 3  . metoprolol succinate (TOPROL-XL) 25 MG 24 hr tablet Take 50 mg daily by mouth.    . mometasone (NASONEX) 50 MCG/ACT nasal spray Place 2 sprays into the nose daily as  needed (allergies).    . sildenafil (VIAGRA) 50 MG tablet Take 50 mg by mouth daily as needed for erectile dysfunction.    Marland Kitchen testosterone cypionate (DEPOTESTOSTERONE CYPIONATE) 200 MG/ML injection Inject 200 mg into the muscle every 14 (fourteen) days.    . metoprolol succinate (TOPROL-XL) 25 MG 24 hr tablet TAKE 1 TABLET BY MOUTH EVERY DAY (Patient not taking: Reported on 12/31/2016) 90 tablet 0   No facility-administered medications prior to visit.      Allergies:   Patient has no known allergies.   Social History   Socioeconomic History  . Marital status: Married    Spouse name: None  . Number of children: None  . Years of education: None  . Highest education level: None  Social Needs  . Financial resource strain: None  . Food insecurity - worry: None  . Food insecurity - inability: None  . Transportation needs - medical: None  . Transportation needs - non-medical: None  Occupational History  . None  Tobacco Use  . Smoking status: Former Smoker    Last attempt to quit: 09/18/1990    Years since quitting: 26.3  . Smokeless tobacco: Former Neurosurgeon    Types: Snuff    Quit date: 09/18/1990  Substance and Sexual Activity  . Alcohol use: Yes    Comment: avg  4oz alcohol/day  . Drug use: No  . Sexual activity: None  Other Topics Concern  . None  Social History Narrative  . None     Family History:  The patient's family history includes Emphysema in his mother; Lactose intolerance in his father.   ROS:   Please see the history of present illness.    none  All other systems reviewed and are negative.   PHYSICAL EXAM:   VS:  BP (!) 142/82   Pulse (!) 130   Ht 5\' 7"  (1.702 m)   Wt 248 lb 12.8 oz (112.9 kg)   BMI 38.97 kg/m    GEN: Well nourished, well developed, in no acute distress  HEENT: normal  Neck: no JVD, carotid bruits, or masses Cardiac: rapid RRR; no murmurs, rubs, or gallops,no edema  Respiratory:  clear to auscultation bilaterally, normal work of  breathing GI: soft, nontender, nondistended, + BS MS: no deformity or atrophy  Skin: warm and dry, no rash Neuro:  Alert and Oriented x 3, Strength and sensation are intact Psych: euthymic mood, full affect  Wt Readings from Last 3 Encounters:  12/31/16 248 lb 12.8 oz (112.9 kg)  01/15/16 245 lb 9.6 oz (111.4 kg)  11/19/15 230 lb (104.3 kg)      Studies/Labs Reviewed:   EKG:  EKG today's EKG is compared to a tracing from 11/21/2015 which revealed normal sinus rhythm, left atrial abnormality, and otherwise normal appearance.  Today's study atrial flutter with 2-1 AV conduction and ventricular rate of 130 bpm.  No ischemic changes are noted.  Recent Labs: No results found for requested labs within last 8760 hours.   Lipid Panel No results found for: CHOL, TRIG, HDL, CHOLHDL, VLDL, LDLCALC, LDLDIRECT  Additional studies/ records that were reviewed today include:  Echocardiography 2017: Study Conclusions   - Left ventricle: The cavity size was normal. There was mild focal   basal hypertrophy of the septum. Systolic function was normal.   The estimated ejection fraction was in the range of 55% to 60%.   Wall motion was normal; there were no regional wall motion   abnormalities. Left ventricular diastolic function parameters   were normal. - Aortic valve: Transvalvular velocity was within the normal range.   There was no stenosis. There was no regurgitation. - Mitral valve: Transvalvular velocity was within the normal range.   There was no evidence for stenosis. There was no regurgitation. - Right ventricle: The cavity size was normal. Wall thickness was   normal. Systolic function was normal. - Tricuspid valve: There was physiologic regurgitation. - Pulmonary arteries: Systolic pressure was within the normal   range. PA peak pressure: 26 mm Hg (S).    ASSESSMENT:    1. Typical atrial flutter (HCC)   2. Morbid obesity (HCC)      PLAN:  In order of problems listed  above:  1. He is in atrial flutter today and is minimally if any symptomatic despite rapid ventricular response at 130 bpm..  I am suspicious that he is out of rhythm the majority of the time.  Plan 48-hour Holter monitor to determine burden of atrial flutter and heart rate control.  If he is in atrial flutter the majority of the time we will need to convert to rhythm control with antiarrhythmic or consider ablation.  We discussed better compliance with medications.  He should be on metoprolol 50 mg every day but tells me he does not always remember to take it. 2. We discussed  weight loss.  We discussed weight loss last year as well. 3. Patient needs risk factor modification that includes weight loss, reduction in alcohol intake, place with medical therapy.    Medication Adjustments/Labs and Tests Ordered: Current medicines are reviewed at length with the patient today.  Concerns regarding medicines are outlined above.  Medication changes, Labs and Tests ordered today are listed in the Patient Instructions below. Patient Instructions  Medication Instructions:  Your physician recommends that you continue on your current medications as directed. Please refer to the Current Medication list given to you today.  Labwork: None  Testing/Procedures: Your physician has recommended that you wear a 48 hour holter monitor. Holter monitors are medical devices that record the heart's electrical activity. Doctors most often use these monitors to diagnose arrhythmias. Arrhythmias are problems with the speed or rhythm of the heartbeat. The monitor is a small, portable device. You can wear one while you do your normal daily activities. This is usually used to diagnose what is causing palpitations/syncope (passing out).    Follow-Up: Your physician recommends that you schedule a follow-up appointment in: 1 month with Dr. Katrinka Blazing. (Can have 12/7 or 12/10 at 10:20A)   Any Other Special Instructions Will Be Listed  Below (If Applicable).     If you need a refill on your cardiac medications before your next appointment, please call your pharmacy.      Signed, Lesleigh Noe, MD  12/31/2016 5:14 PM    [facility] [address] [place], [place], [place]  [address] Phone: [phone]; Fax: [phone]

## 2017-01-01 ENCOUNTER — Ambulatory Visit (INDEPENDENT_AMBULATORY_CARE_PROVIDER_SITE_OTHER): Payer: BC Managed Care – PPO

## 2017-01-01 ENCOUNTER — Telehealth: Payer: Self-pay | Admitting: Interventional Cardiology

## 2017-01-01 DIAGNOSIS — I483 Typical atrial flutter: Secondary | ICD-10-CM | POA: Diagnosis not present

## 2017-01-01 NOTE — Telephone Encounter (Signed)
New message    Pt came in for a heart monitor today. He wants to know if he can take motrin or something for the pain in his hip? Please call.

## 2017-01-01 NOTE — Telephone Encounter (Signed)
Spoke with pt and advised ok to take for a short period of time but we do not recommend this long term.  Advised likely shouldn't take longer than a week and should try Tylenol instead.  Pt verbalized understanding.

## 2017-01-31 ENCOUNTER — Telehealth: Payer: Self-pay | Admitting: Endocrinology

## 2017-01-31 NOTE — Telephone Encounter (Signed)
Please refer this to PCP

## 2017-01-31 NOTE — Telephone Encounter (Signed)
[facility] uses [facility]. Pt says sore throat and sinuses. Pt believes he has strep throat again. Pt gets off work at 2:30 and wants to be seen or have meds called in. Please advise.

## 2017-01-31 NOTE — Telephone Encounter (Signed)
I called patient but VM box was full.  

## 2017-02-04 NOTE — Progress Notes (Signed)
Cardiology Office Note    Date:  02/05/2017   ID:  Marvin Simmons, DOB November 13, 1960, MRN [id number]  PCP:  Delorse Lek, MD  Cardiologist: Lesleigh Noe, MD   Chief Complaint  Patient presents with  . 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  This occurred in 2017.   He still feels out of energy.  He denies palpitations.  He has not had chest pain, syncope, neurological symptoms, or edema.  He is accompanied by his wife.   Sleep study performed within the past 12 months was negative.  Drinks at least 2 shots of alcohol daily for greater than 10 years.  Heavy on the weekends.   Past Medical History:  Diagnosis Date  . Atrial flutter (HCC) 2017  . Morbid obesity (HCC) 12/31/2016    Past Surgical History:  Procedure Laterality Date  . SHOULDER SURGERY Right   . TONSILLECTOMY      Current Medications: Outpatient Medications Prior to Visit  Medication Sig Dispense Refill  . albuterol (PROAIR HFA) 108 (90 Base) MCG/ACT inhaler Inhale 1-2 puffs into the lungs every 6 (six) hours as needed for shortness of breath.     . metoprolol succinate (TOPROL-XL) 25 MG 24 hr tablet Take 50 mg daily by mouth.    . mometasone (NASONEX) 50 MCG/ACT nasal spray Place 2 sprays into the nose daily as needed (allergies).    . sildenafil (VIAGRA) 50 MG tablet Take 50 mg by mouth daily as needed for erectile dysfunction.    Marland Kitchen testosterone cypionate (DEPOTESTOSTERONE CYPIONATE) 200 MG/ML injection Inject 200 mg into the muscle every 14 (fourteen) days.    Marland Kitchen aspirin EC 81 MG tablet Take 1 tablet (81 mg total) by mouth daily. 90 tablet 3   No facility-administered medications prior to visit.      Allergies:   Patient has no known allergies.   Social History    Socioeconomic History  . Marital status: Married    Spouse name: None  . Number of children: None  . Years of education: None  . Highest education level: None  Social Needs  . Financial resource strain: None  . Food insecurity - worry: None  . Food insecurity - inability: None  . Transportation needs - medical: None  . Transportation needs - non-medical: None  Occupational History  . None  Tobacco Use  . Smoking status: Former Smoker    Last attempt to quit: 09/18/1990    Years since quitting: 26.4  . Smokeless tobacco: Former Neurosurgeon    Types: Snuff    Quit date: 09/18/1990  Substance and Sexual Activity  . Alcohol use: Yes    Comment: avg 4oz alcohol/day  . Drug use: No  . Sexual activity: None  Other Topics Concern  . None  Social History Narrative  . None     Family History:  The patient's family history includes Emphysema in his mother; Lactose intolerance in his father.   ROS:   Please see the history of present illness.    Muscle pain.  Anxiety. All other systems reviewed and are negative.   PHYSICAL EXAM:   VS:  BP 134/76   Pulse 98   Ht 5\' 6"  (1.676 m)   Wt 250 lb (113.4 kg)  BMI 40.35 kg/m    GEN: Well nourished, well developed, in no acute distress.  Obesity. HEENT: normal  Neck: no JVD, carotid bruits, or masses Cardiac: IRR; no murmurs, rubs, or gallops,no edema  Respiratory:  clear to auscultation bilaterally, normal work of breathing GI: soft, nontender, nondistended, + BS MS: no deformity or atrophy  Skin: warm and dry, no rash Neuro:  Alert and Oriented x 3, Strength and sensation are intact Psych: euthymic mood, full affect  Wt Readings from Last 3 Encounters:  02/05/17 250 lb (113.4 kg)  12/31/16 248 lb 12.8 oz (112.9 kg)  01/15/16 245 lb 9.6 oz (111.4 kg)      Studies/Labs Reviewed:   EKG:  EKG  Not done  Recent Labs: No results found for requested labs within last 8760 hours.   Lipid Panel No results found for: CHOL, TRIG,  HDL, CHOLHDL, VLDL, LDLCALC, LDLDIRECT  Additional studies/ records that were reviewed today include:  Study Conclusions   - Left ventricle: The cavity size was normal. There was mild focal   basal hypertrophy of the septum. Systolic function was normal.   The estimated ejection fraction was in the range of 55% to 60%.   Wall motion was normal; there were no regional wall motion   abnormalities. Left ventricular diastolic function parameters   were normal. - Aortic valve: Transvalvular velocity was within the normal range.   There was no stenosis. There was no regurgitation. - Mitral valve: Transvalvular velocity was within the normal range.   There was no evidence for stenosis. There was no regurgitation. - Right ventricle: The cavity size was normal. Wall thickness was   normal. Systolic function was normal. - Tricuspid valve: There was physiologic regurgitation. - Pulmonary arteries: Systolic pressure was within the normal   range. PA peak pressure: 26 mm Hg (S).  48-hour Holter monitor 01/01/2017: Study Highlights      Continuous atrial flutter  Average HR 106 bpm with range 52-161   Continuous atrial flutter with RVR, Ave HR 106 bpm     ASSESSMENT:    1. Typical atrial flutter (HCC)   2. Anticoagulation adequate      PLAN:  In order of problems listed above:  1. As noted on 48-hour monitor he is continuously in atrial flutter with poor rate control.  LV size and function are normal by echo.  He has minimal symptoms.  Plan to start Eliquis 5 mg twice daily, amiodarone 200 mg twice daily, continue metoprolol as currently taking (50 mg daily).  Return in 3 weeks for EKG and to be set up for elective electrical cardioversion if still in atrial flutter.  Eventually decrease amiodarone to 200 mg/day.  Atrial flutter has caused the patient to have excessive exertional fatigue.  He is cautioned to discontinue aspirin, alcohol, and nonsteroidal anti-inflammatory agents.  He is  cautioned to examine urine and stool for evidence of bleeding.  Notify us if any head trauma.  Concepts of electrical conversion were discussed in detail with the patient and wife. 2. Known normal renal function.  Eliquis 5 mg twice daily and discontinuation of aspirin is recommended.  Prolonged office visit with greater than 50% of the time spent in counseling and coordination of care.  Medication Adjustments/Labs and Tests Ordered: Current medicines are reviewed at length with the patient today.  Concerns regarding medicines are outlined above.  Medication changes, Labs and Tests ordered today are listed in the Patient Instructions below. Patient Instructions  Medication Instructions:  1)  DISCONTINUE Aspirin 2) START Amiodarone 200mg  twice daily 3) START Eliquis 5mg  twice daily  Labwork: Your physician recommends that you return for lab work at time of next visit.   Testing/Procedures: None  Follow-Up: Your physician recommends that you schedule a follow-up appointment in: 3 weeks with PA or NP.     Any Other Special Instructions Will Be Listed Below (If Applicable).     If you need a refill on your cardiac medications before your next appointment, please call your pharmacy.      Signed, Lesleigh NoeHenry W Smith III, MD  02/05/2017 3:50 PM    [facility] [address], [place], [place]  [id number] Phone: [phone]; Fax: [phone]

## 2017-02-05 ENCOUNTER — Encounter: Payer: Self-pay | Admitting: Interventional Cardiology

## 2017-02-05 ENCOUNTER — Ambulatory Visit: Payer: BC Managed Care – PPO | Admitting: Interventional Cardiology

## 2017-02-05 ENCOUNTER — Encounter (INDEPENDENT_AMBULATORY_CARE_PROVIDER_SITE_OTHER): Payer: Self-pay

## 2017-02-05 DIAGNOSIS — Z7901 Long term (current) use of anticoagulants: Secondary | ICD-10-CM

## 2017-02-05 DIAGNOSIS — I483 Typical atrial flutter: Secondary | ICD-10-CM | POA: Diagnosis not present

## 2017-02-05 MED ORDER — AMIODARONE HCL 200 MG PO TABS: 200.0000 mg | ORAL_TABLET | Freq: Two times a day (BID) | ORAL | 3 refills | Status: DC

## 2017-02-05 MED ORDER — APIXABAN 5 MG PO TABS: 5.0000 mg | ORAL_TABLET | Freq: Two times a day (BID) | ORAL | 3 refills | Status: DC

## 2017-02-05 NOTE — Patient Instructions (Addendum)
Medication Instructions:  1) DISCONTINUE Aspirin 2) START Amiodarone 200mg  twice daily 3) START Eliquis 5mg  twice daily  Labwork: Your physician recommends that you return for lab work at time of next visit.   Testing/Procedures: None  Follow-Up: Your physician recommends that you schedule a follow-up appointment in: 3 weeks with PA or NP.     Any Other Special Instructions Will Be Listed Below (If Applicable).  Please do not drink alcohol.    If you need a refill on your cardiac medications before your next appointment, please call your pharmacy.

## 2017-02-26 ENCOUNTER — Other Ambulatory Visit: Payer: BC Managed Care – PPO

## 2017-02-26 ENCOUNTER — Encounter: Payer: Self-pay | Admitting: Cardiology

## 2017-02-26 ENCOUNTER — Ambulatory Visit: Payer: BC Managed Care – PPO | Admitting: Cardiology

## 2017-02-26 VITALS — BP 100/70 | HR 89 | Ht 66.0 in | Wt 252.8 lb

## 2017-02-26 DIAGNOSIS — I483 Typical atrial flutter: Secondary | ICD-10-CM | POA: Diagnosis not present

## 2017-02-26 DIAGNOSIS — I4892 Unspecified atrial flutter: Secondary | ICD-10-CM | POA: Diagnosis not present

## 2017-02-26 DIAGNOSIS — Z7901 Long term (current) use of anticoagulants: Secondary | ICD-10-CM | POA: Diagnosis not present

## 2017-02-26 NOTE — Progress Notes (Signed)
 Cardiology Office Note   Date:  02/26/2017   ID:  [name], DOB 01/06/1961, MRN [id number]  PCP:  Burnett, Brent A, MD  Cardiologist:  Dr. Smith    Chief Complaint  Patient presents with  . Atrial Flutter    here to arrange DCCV.        History of Present Illness: Marvin Simmons is a 57 y.o. male who presents for  A fluter now on Eliquis, amiodarone and BB, if continues in a fib will arrange DCCV    He was recently diagnosed with atrial flutterwho is self-referred after noticing times when heart rate will be greater than 130 bpm. This occurred in 2017.   Initially he was out of energy.  He denies palpitations.  He has not had chest pain, syncope, neurological symptoms, or edema.  He is accompanied by his wife.  Sleep study performed within the past 12 months was negative.  Drinks at least 2 shots of alcohol daily for greater than 10 years.  Heavy on the weekends. Last visit he was placed on Eliquis and amiodarone 200 mg BID, on 02/05/17.    Today he continues in a flutter rate controlled at 89.  No symptoms at all, is not aware that he is in this rhythm.  His BP is lower but no lightheadedness or dizziness.   When asked about Eliquis and any missed doses he cannot swear he took everyone.  May have missed one.   We discussed TEE and pt is agreeable to this with DCCV.     Pt also wants to come off most of the meds - we discussed he will need them for weeks at least though post DCCV we may decrease amiodarone to 200 mg daily.    Past Medical History:  Diagnosis Date  . Atrial flutter (HCC) 2017  . Morbid obesity (HCC) 12/31/2016    Past Surgical History:  Procedure Laterality Date  . SHOULDER SURGERY Right   . TONSILLECTOMY       Current Outpatient Medications  Medication Sig Dispense Refill  . albuterol (PROAIR HFA) 108 (90 Base) MCG/ACT inhaler Inhale 1-2 puffs into the lungs every 6 (six) hours as needed for shortness of breath.     . amiodarone  (PACERONE) 200 MG tablet Take 1 tablet (200 mg total) by mouth 2 (two) times daily. 180 tablet 3  . apixaban (ELIQUIS) 5 MG TABS tablet Take 1 tablet (5 mg total) by mouth 2 (two) times daily. 180 tablet 3  . metoprolol succinate (TOPROL-XL) 25 MG 24 hr tablet Take 50 mg daily by mouth.    . mometasone (NASONEX) 50 MCG/ACT nasal spray Place 2 sprays into the nose daily as needed (allergies).    . sildenafil (VIAGRA) 50 MG tablet Take 50 mg by mouth daily as needed for erectile dysfunction.    . testosterone cypionate (DEPOTESTOSTERONE CYPIONATE) 200 MG/ML injection Inject 200 mg into the muscle every 14 (fourteen) days.     No current facility-administered medications for this visit.     Allergies:   Patient has no known allergies.    Social History:  The patient  reports that he quit smoking about 26 years ago. He quit smokeless tobacco use about 26 years ago. His smokeless tobacco use included snuff. He reports that he drinks alcohol. He reports that he does not use drugs.   Family History:  The patient's family history includes Emphysema in his mother; Lactose intolerance in his father.    ROS:    General:no colds or fevers, no weight changes Skin:no rashes or ulcers HEENT:no blurred vision, no congestion CV:see HPI PUL:see HPI GI:no diarrhea constipation or melena, no indigestion GU:no hematuria, no dysuria MS:no joint pain, no claudication Neuro:no syncope, no lightheadedness Endo:no diabetes, no thyroid disease  Wt Readings from Last 3 Encounters:  02/26/17 252 lb 12.8 oz (114.7 kg)  02/05/17 250 lb (113.4 kg)  12/31/16 248 lb 12.8 oz (112.9 kg)     PHYSICAL EXAM: VS:  BP 100/70   Pulse 89   Ht 5' 6" (1.676 m)   Wt 252 lb 12.8 oz (114.7 kg)   SpO2 94%   BMI 40.80 kg/m  , BMI Body mass index is 40.8 kg/m. General:Pleasant affect, NAD Skin:Warm and dry, brisk capillary refill HEENT:normocephalic, sclera clear, mucus membranes moist Neck:supple, no JVD, no bruits    Heart:S1S2 irreg  without murmur, gallup, rub or click Lungs:clear without rales, rhonchi, or wheezes Abd:soft, non tender, + BS, do not palpate liver spleen or masses Ext:no lower ext edema, 2+ pedal pulses, 2+ radial pulses Neuro:alert and oriented X 3, MAE, follows commands, + facial symmetry    EKG:  EKG is ordered today. The ekg ordered today demonstrates Atrial flutter rate controlled and no acute changes.    Recent Labs: No results found for requested labs within last 8760 hours.    Lipid Panel No results found for: CHOL, TRIG, HDL, CHOLHDL, VLDL, LDLCALC, LDLDIRECT     Other studies Reviewed: Additional studies/ records that were reviewed today include: . Holter monitor   Continuous atrial flutter  Average HR 106 bpm with range 52-161     ASSESSMENT AND PLAN:  1.  Typical a flutter now rate controlled at 89, currently asymptomatic- will plan for TEE DCCV.  He will follow up post DCCV with Dr. Smith to decide on meds and length of anticoagulation.   2.  Anticoagulation with plan for DCCV, may have missed a dose but pt wants to have done soon so will proceed with TEE DCCV.  Pt and wife agreeable.    [facility] has been requested to perform a transesophageal echocardiogram on Marvin Simmons for a flutter.  After careful review of history and examination, the risks and benefits of transesophageal echocardiogram have been explained including risks of esophageal damage, perforation (1:10,000 risk), bleeding, pharyngeal hematoma as well as other potential complications associated with conscious sedation including aspiration, arrhythmia, respiratory failure and death. Alternatives to treatment were discussed, questions were answered. Patient is willing to proceed.   Discussed DCCV and chance of bradycardia.     Current medicines are reviewed with the patient today.  The patient Has no concerns regarding medicines.  The following changes have been made:  See  above Labs/ tests ordered today include:see above  Disposition:   FU:  see above  Signed, Laura Ingold, NP  02/26/2017 3:58 PM    [facility] [address], [place], [place]  [address]/ [address] [place], [place] Phone: [phone]; Fax: [phone]  [phone] 

## 2017-02-26 NOTE — H&P (View-Only) (Signed)
Cardiology Office Note   Date:  02/26/2017   ID:  Marvin Simmons, DOB 20-Jul-1960, MRN [id number]  PCP:  Delorse LekBurnett, Marvin A, MD  Cardiologist:  Dr. Katrinka BlazingSmith    Chief Complaint  Patient presents with  . Atrial Flutter    here to arrange DCCV.        History of Present Illness: Marvin Simmons is a 57 y.o. male who presents for  A fluter now on Eliquis, amiodarone and BB, if continues in a fib will arrange DCCV    He was recently diagnosed with atrial flutterwho is self-referred after noticing times when heart rate will be greater than 130 bpm. This occurred in 2017.   Initially he was out of energy.  He denies palpitations.  He has not had chest pain, syncope, neurological symptoms, or edema.  He is accompanied by his wife.  Sleep study performed within the past 12 months was negative.  Drinks at least 2 shots of alcohol daily for greater than 10 years.  Heavy on the weekends. Last visit he was placed on Eliquis and amiodarone 200 mg BID, on 02/05/17.    Today he continues in a flutter rate controlled at 89.  No symptoms at all, is not aware that he is in this rhythm.  His BP is lower but no lightheadedness or dizziness.   When asked about Eliquis and any missed doses he cannot swear he took everyone.  May have missed one.   We discussed TEE and pt is agreeable to this with DCCV.     Pt also wants to come off most of the meds - we discussed he will need them for weeks at least though post DCCV we may decrease amiodarone to 200 mg daily.    Past Medical History:  Diagnosis Date  . Atrial flutter (HCC) 2017  . Morbid obesity (HCC) 12/31/2016    Past Surgical History:  Procedure Laterality Date  . SHOULDER SURGERY Right   . TONSILLECTOMY       Current Outpatient Medications  Medication Sig Dispense Refill  . albuterol (PROAIR HFA) 108 (90 Base) MCG/ACT inhaler Inhale 1-2 puffs into the lungs every 6 (six) hours as needed for shortness of breath.     Marland Kitchen. amiodarone  (PACERONE) 200 MG tablet Take 1 tablet (200 mg total) by mouth 2 (two) times daily. 180 tablet 3  . apixaban (ELIQUIS) 5 MG TABS tablet Take 1 tablet (5 mg total) by mouth 2 (two) times daily. 180 tablet 3  . metoprolol succinate (TOPROL-XL) 25 MG 24 hr tablet Take 50 mg daily by mouth.    . mometasone (NASONEX) 50 MCG/ACT nasal spray Place 2 sprays into the nose daily as needed (allergies).    . sildenafil (VIAGRA) 50 MG tablet Take 50 mg by mouth daily as needed for erectile dysfunction.    Marland Kitchen. testosterone cypionate (DEPOTESTOSTERONE CYPIONATE) 200 MG/ML injection Inject 200 mg into the muscle every 14 (fourteen) days.     No current facility-administered medications for this visit.     Allergies:   Patient has no known allergies.    Social History:  The patient  reports that he quit smoking about 26 years ago. He quit smokeless tobacco use about 26 years ago. His smokeless tobacco use included snuff. He reports that he drinks alcohol. He reports that he does not use drugs.   Family History:  The patient's family history includes Emphysema in his mother; Lactose intolerance in his father.    ROS:  General:no colds or fevers, no weight changes Skin:no rashes or ulcers HEENT:no blurred vision, no congestion CV:see HPI PUL:see HPI GI:no diarrhea constipation or melena, no indigestion GU:no hematuria, no dysuria MS:no joint pain, no claudication Neuro:no syncope, no lightheadedness Endo:no diabetes, no thyroid disease  Wt Readings from Last 3 Encounters:  02/26/17 252 lb 12.8 oz (114.7 kg)  02/05/17 250 lb (113.4 kg)  12/31/16 248 lb 12.8 oz (112.9 kg)     PHYSICAL EXAM: VS:  BP 100/70   Pulse 89   Ht 5\' 6"  (1.676 m)   Wt 252 lb 12.8 oz (114.7 kg)   SpO2 94%   BMI 40.80 kg/m  , BMI Body mass index is 40.8 kg/m. General:Pleasant affect, NAD Skin:Warm and dry, brisk capillary refill HEENT:normocephalic, sclera clear, mucus membranes moist Neck:supple, no JVD, no bruits    Heart:S1S2 irreg  without murmur, gallup, rub or click Lungs:clear without rales, rhonchi, or wheezes ZOX:WRUE, non tender, + BS, do not palpate liver spleen or masses Ext:no lower ext edema, 2+ pedal pulses, 2+ radial pulses Neuro:alert and oriented X 3, MAE, follows commands, + facial symmetry    EKG:  EKG is ordered today. The ekg ordered today demonstrates Atrial flutter rate controlled and no acute changes.    Recent Labs: No results found for requested labs within last 8760 hours.    Lipid Panel No results found for: CHOL, TRIG, HDL, CHOLHDL, VLDL, LDLCALC, LDLDIRECT     Other studies Reviewed: Additional studies/ records that were reviewed today include: . Holter monitor   Continuous atrial flutter  Average HR 106 bpm with range 52-161     ASSESSMENT AND PLAN:  1.  Typical a flutter now rate controlled at 89, currently asymptomatic- will plan for TEE DCCV.  He will follow up post DCCV with Dr. Katrinka Blazing to decide on meds and length of anticoagulation.   2.  Anticoagulation with plan for DCCV, may have missed a dose but pt wants to have done soon so will proceed with TEE DCCV.  Pt and wife agreeable.    [facility] has been requested to perform a transesophageal echocardiogram on Marvin Crow for a flutter.  After careful review of history and examination, the risks and benefits of transesophageal echocardiogram have been explained including risks of esophageal damage, perforation (1:10,000 risk), bleeding, pharyngeal hematoma as well as other potential complications associated with conscious sedation including aspiration, arrhythmia, respiratory failure and death. Alternatives to treatment were discussed, questions were answered. Patient is willing to proceed.   Discussed DCCV and chance of bradycardia.     Current medicines are reviewed with the patient today.  The patient Has no concerns regarding medicines.  The following changes have been made:  See  above Labs/ tests ordered today include:see above  Disposition:   FU:  see above  Signed, Nada Boozer, NP  02/26/2017 3:58 PM    [facility] [address] [place], [place], [place]  [address]/ [address] [place], [place] Phone: [phone]; Fax: [phone]  [phone]

## 2017-02-26 NOTE — Patient Instructions (Addendum)
Medication Instructions:  Your physician recommends that you continue on your current medications as directed. Please refer to the Current Medication list given to you today.   Labwork: TODAY CBC, BMET  Testing/Procedures: Your physician has requested that you have a TEE/Cardioversion. During a TEE, sound waves are used to create images of your heart. It provides your doctor with information about the size and shape of your heart and how well your heart's chambers and valves are working. In this test, a transducer is attached to the end of a flexible tube that is guided down you throat and into your esophagus (the tube leading from your mouth to your stomach) to get a more detailed image of your heart. Once the TEE has determined that a blood clot is not present, the cardioversion begins. Electrical Cardioversion uses a jolt of electricity to your heart either through paddles or wired patches attached to your chest. This is a controlled, usually prescheduled, procedure. This procedure is done at the hospital and you are not awake during the procedure. You usually go home the day of the procedure. Please see the instruction sheet given to you today for more information.   Follow-Up: 04/02/17 @ 4:20 WITH DR. Katrinka BlazingSMITH; FOLLOW UP POST PROCEDURE  Any Other Special Instructions Will Be Listed Below (If Applicable). If you need a refill on your cardiac medications before your next appointment, please call your pharmacy.   You are scheduled for a TEE Cardioversion on Friday January 11  with Dr. Royann Shiversroitoru.  Please [facility]: [address] [place], [place] [address] at 10:00 am. (1 hour prior to procedure unless lab work is needed)  DIET: Nothing to eat or drink after midnight except a sip of water with medications (see medication instructions below)  Medication Instructions: You may take your medications on the morning of your procedure Continue your  anticoagulant: Eliquis You will need to continue your anticoagulant after your procedure until you are told by your  Provider that it is safe to stop  Labs: (Pt/INR if patient is on coumadin, BMET, CBC)  You will have your lab work done today. You do not have to be fasting. You must have a responsible person to drive you home and stay in the waiting area during your procedure. Failure to do so could result in cancellation. Bring your insurance cards. *Special Note: Every effort is made to have your procedure done on time. Occasionally there are emergencies that occur at the hospital that may cause delays. Please be patient if a delay does occur.

## 2017-02-27 LAB — BASIC METABOLIC PANEL
BUN/Creatinine Ratio: 13 (ref 9–20)
BUN: 14 mg/dL (ref 6–24)
CALCIUM: 9.3 mg/dL (ref 8.7–10.2)
CHLORIDE: 103 mmol/L (ref 96–106)
CO2: 20 mmol/L (ref 20–29)
Creatinine, Ser: 1.05 mg/dL (ref 0.76–1.27)
GFR calc Af Amer: 91 mL/min/{1.73_m2} (ref >59)
GFR calc non Af Amer: 78 mL/min/{1.73_m2} (ref >59)
GLUCOSE: 93 mg/dL (ref 65–99)
POTASSIUM: 4.7 mmol/L (ref 3.5–5.2)
Sodium: 142 mmol/L (ref 134–144)

## 2017-02-27 LAB — CBC
Hematocrit: 50.3 % (ref 37.5–51.0)
Hemoglobin: 17.3 g/dL (ref 13.0–17.7)
MCH: 33.3 pg — ABNORMAL HIGH (ref 26.6–33.0)
MCHC: 34.4 g/dL (ref 31.5–35.7)
MCV: 97 fL (ref 79–97)
PLATELETS: 250 10*3/uL (ref 150–379)
RBC: 5.19 x10E6/uL (ref 4.14–5.80)
RDW: 14.1 % (ref 12.3–15.4)
WBC: 7.6 10*3/uL (ref 3.4–10.8)

## 2017-03-04 ENCOUNTER — Telehealth: Payer: Self-pay | Admitting: Interventional Cardiology

## 2017-03-04 NOTE — Telephone Encounter (Signed)
New message ° ° °Patient calling for lab results. Please call °

## 2017-03-04 NOTE — Telephone Encounter (Signed)
-----   Message from Leone BrandLaura R Ingold, NP sent at 02/28/2017  9:16 AM EST ----- Labs good for TEE DCCV

## 2017-03-04 NOTE — Telephone Encounter (Signed)
Returned pts call and discussed his pre-op lab results. See result note.

## 2017-03-07 ENCOUNTER — Encounter (HOSPITAL_COMMUNITY): Payer: Self-pay | Admitting: *Deleted

## 2017-03-07 ENCOUNTER — Ambulatory Visit (HOSPITAL_COMMUNITY)
Admission: RE | Admit: 2017-03-07 | Discharge: 2017-03-07 | Disposition: A | Discharge status: Home / Self Care | Payer: BC Managed Care – PPO | Source: Ambulatory Visit | Attending: Cardiovascular Disease | Admitting: Cardiovascular Disease

## 2017-03-07 ENCOUNTER — Ambulatory Visit (HOSPITAL_COMMUNITY): Payer: BC Managed Care – PPO | Admitting: Certified Registered Nurse Anesthetist

## 2017-03-07 ENCOUNTER — Other Ambulatory Visit (HOSPITAL_COMMUNITY): Payer: Self-pay | Admitting: *Deleted

## 2017-03-07 ENCOUNTER — Encounter (HOSPITAL_COMMUNITY)
Admission: RE | Disposition: A | Discharge status: Home / Self Care | Payer: Self-pay | Source: Ambulatory Visit | Attending: Cardiovascular Disease

## 2017-03-07 ENCOUNTER — Ambulatory Visit (HOSPITAL_BASED_OUTPATIENT_CLINIC_OR_DEPARTMENT_OTHER): Payer: BC Managed Care – PPO

## 2017-03-07 DIAGNOSIS — Z7901 Long term (current) use of anticoagulants: Secondary | ICD-10-CM | POA: Diagnosis not present

## 2017-03-07 DIAGNOSIS — I4892 Unspecified atrial flutter: Secondary | ICD-10-CM

## 2017-03-07 DIAGNOSIS — I483 Typical atrial flutter: Secondary | ICD-10-CM | POA: Diagnosis not present

## 2017-03-07 DIAGNOSIS — Z87891 Personal history of nicotine dependence: Secondary | ICD-10-CM | POA: Diagnosis not present

## 2017-03-07 DIAGNOSIS — Z6841 Body Mass Index (BMI) 40.0 and over, adult: Secondary | ICD-10-CM | POA: Diagnosis not present

## 2017-03-07 DIAGNOSIS — Z79899 Other long term (current) drug therapy: Secondary | ICD-10-CM | POA: Diagnosis not present

## 2017-03-07 HISTORY — PX: TEE WITHOUT CARDIOVERSION: SHX5443

## 2017-03-07 HISTORY — PX: CARDIOVERSION: SHX1299

## 2017-03-07 SURGERY — CARDIOVERSION
Anesthesia: Monitor Anesthesia Care

## 2017-03-07 MED ORDER — LIDOCAINE HCL (CARDIAC) 20 MG/ML IV SOLN
INTRAVENOUS | Status: DC | PRN
  Administered 2017-03-07: 100 mg via INTRAVENOUS

## 2017-03-07 MED ORDER — PROPOFOL 500 MG/50ML IV EMUL
INTRAVENOUS | Status: DC | PRN
  Administered 2017-03-07: 100 ug/kg/min via INTRAVENOUS

## 2017-03-07 MED ORDER — SODIUM CHLORIDE 0.9 % IV SOLN
INTRAVENOUS | Status: DC
  Administered 2017-03-07: 11:00:00 via INTRAVENOUS

## 2017-03-07 MED ORDER — PROPOFOL 10 MG/ML IV BOLUS
INTRAVENOUS | Status: DC | PRN
  Administered 2017-03-07: 20 mg via INTRAVENOUS
  Administered 2017-03-07: 40 mg via INTRAVENOUS
  Administered 2017-03-07 (×2): 30 mg via INTRAVENOUS

## 2017-03-07 MED ORDER — ONDANSETRON HCL 4 MG/2ML IJ SOLN
INTRAMUSCULAR | Status: DC | PRN
  Administered 2017-03-07: 4 mg via INTRAVENOUS

## 2017-03-07 MED ORDER — SODIUM CHLORIDE 0.9 % IV SOLN: INTRAVENOUS | Status: DC

## 2017-03-07 NOTE — Interval H&P Note (Signed)
History and Physical Interval Note:  03/07/2017 10:55 AM  Marvin Simmons  has presented today for surgery, with the diagnosis of aflutter  The various methods of treatment have been discussed with the patient and family. After consideration of risks, benefits and other options for treatment, the patient has consented to  Procedure(s): CARDIOVERSION (N/A) TRANSESOPHAGEAL ECHOCARDIOGRAM (TEE) (N/A) as a surgical intervention .  The patient's history has been reviewed, patient examined, no change in status, stable for surgery.  I have reviewed the patient's chart and labs.  Questions were answered to the patient's satisfaction.     Jesselle Laflamme

## 2017-03-07 NOTE — Progress Notes (Signed)
  Echocardiogram Echocardiogram Transesophageal has been performed.  Roosvelt MaserLane, Kairi Harshbarger F 03/07/2017, 1:18 PM

## 2017-03-07 NOTE — Op Note (Signed)
Procedure: Electrical Cardioversion Indications:  Atrial Flutter  Procedure Details:  Consent: Risks of procedure as well as the alternatives and risks of each were explained to the (patient/caregiver).  Consent for procedure obtained.  Time Out: Verified patient identification, verified procedure, site/side was marked, verified correct patient position, special equipment/implants available, medications/allergies/relevent history reviewed, required imaging and test results available.  Performed  Patient placed on cardiac monitor, pulse oximetry, supplemental oxygen as necessary.  Sedation given: IV propofol, Dr., Noreene LarssonJoslin Pacer pads placed anterior and posterior chest.  Cardioverted 1 time(s).  Cardioversion with synchronized biphasic 120J shock.  Evaluation: Findings: Post procedure EKG shows: NSR Complications: None Patient did tolerate procedure well.  Time Spent Directly with the Patient:  30 minutes   Sherrol Vicars 03/07/2017, 11:25 AM

## 2017-03-07 NOTE — Anesthesia Preprocedure Evaluation (Addendum)
Anesthesia Evaluation  Patient identified by MRN, date of birth, ID band Patient awake    Reviewed: Allergy & Precautions, NPO status , Patient's Chart, lab work & pertinent test results  Airway Mallampati: II  TM Distance: >3 FB Neck ROM: Full    Dental  (+) Teeth Intact, Dental Advisory Given   Pulmonary former smoker,    breath sounds clear to auscultation       Cardiovascular  Rhythm:Irregular Rate:Normal     Neuro/Psych    GI/Hepatic   Endo/Other    Renal/GU      Musculoskeletal   Abdominal (+) + obese,   Peds  Hematology   Anesthesia Other Findings   Reproductive/Obstetrics                           Anesthesia Physical Anesthesia Plan  ASA: III  Anesthesia Plan: MAC and General   Post-op Pain Management:    Induction: Intravenous  PONV Risk Score and Plan: Ondansetron and Dexamethasone  Airway Management Planned:   Additional Equipment:   Intra-op Plan:   Post-operative Plan:   Informed Consent: I have reviewed the patients History and Physical, chart, labs and discussed the procedure including the risks, benefits and alternatives for the proposed anesthesia with the patient or authorized representative who has indicated his/her understanding and acceptance.   Dental advisory given  Plan Discussed with: CRNA and Anesthesiologist  Anesthesia Plan Comments:         Anesthesia Quick Evaluation

## 2017-03-07 NOTE — Discharge Instructions (Signed)

## 2017-03-07 NOTE — Op Note (Signed)
INDICATIONS: atrial flutter  PROCEDURE:   Informed consent was obtained prior to the procedure. The risks, benefits and alternatives for the procedure were discussed and the patient comprehended these risks.  Risks include, but are not limited to, cough, sore throat, vomiting, nausea, somnolence, esophageal and stomach trauma or perforation, bleeding, low blood pressure, aspiration, pneumonia, infection, trauma to the teeth and death.    After a procedural time-out, the oropharynx was anesthetized with 20% benzocaine spray.   During this procedure the patient was administered IV propofol by Anesthesiology, Dr. Noreene LarssonJoslin.  The transesophageal probe was inserted in the esophagus and stomach without difficulty and multiple views were obtained.  The patient was kept under observation until the patient left the procedure room.  The patient left the procedure room in stable condition.   Agitated microbubble saline contrast was not administered.  COMPLICATIONS:    There were no immediate complications.  FINDINGS:  Mildly depressed LVEF 40%, global pattern. No evidence of atrial thrombus  RECOMMENDATIONS:     Proceed with cardioversion.  Time Spent Directly with the Patient:  30 minutes   Marvin Simmons 03/07/2017, 11:22 AM

## 2017-03-07 NOTE — Anesthesia Postprocedure Evaluation (Signed)
Anesthesia Post Note  Patient: Denton Meek Nofziger  Procedure(s) Performed: CARDIOVERSION (N/A ) TRANSESOPHAGEAL ECHOCARDIOGRAM (TEE) (N/A )     Patient location during evaluation: Endoscopy Anesthesia Type: MAC Level of consciousness: awake and alert Pain management: pain level controlled Vital Signs Assessment: post-procedure vital signs reviewed and stable Respiratory status: spontaneous breathing, nonlabored ventilation, respiratory function stable and patient connected to nasal cannula oxygen Cardiovascular status: blood pressure returned to baseline and stable Postop Assessment: no apparent nausea or vomiting Anesthetic complications: no    Last Vitals:  Vitals:   03/07/17 1135 03/07/17 1140  BP: 110/67 116/82  Pulse: 76 68  Resp: 15 13  Temp:    SpO2: 96% 96%    Last Pain:  Vitals:   03/07/17 1130  TempSrc: Oral                 Caid Radin COKER

## 2017-03-07 NOTE — Transfer of Care (Signed)
Immediate Anesthesia Transfer of Care Note  Patient: Marvin Simmons  Procedure(s) Performed: CARDIOVERSION (N/A ) TRANSESOPHAGEAL ECHOCARDIOGRAM (TEE) (N/A )  Patient Location: Endoscopy Unit  Anesthesia Type:MAC  Level of Consciousness: awake, alert  and oriented  Airway & Oxygen Therapy: Patient Spontanous Breathing and Patient connected to nasal cannula oxygen  Post-op Assessment: Report given to RN and Post -op Vital signs reviewed and stable  Post vital signs: Reviewed and stable  Last Vitals:  Vitals:   03/07/17 0959 03/07/17 1130  BP: (!) 148/94 (!) 132/114  Pulse: 81 74  Resp: 13 11  Temp: 36.8 C 36.5 C  SpO2: 97% 96%    Last Pain:  Vitals:   03/07/17 1130  TempSrc: Oral         Complications: No apparent anesthesia complications

## 2017-03-11 ENCOUNTER — Other Ambulatory Visit (HOSPITAL_COMMUNITY): Payer: Self-pay | Admitting: Interventional Cardiology

## 2017-04-02 ENCOUNTER — Encounter: Payer: Self-pay | Admitting: Interventional Cardiology

## 2017-04-02 ENCOUNTER — Ambulatory Visit: Payer: BC Managed Care – PPO | Admitting: Interventional Cardiology

## 2017-04-02 VITALS — BP 112/70 | HR 62 | Ht 66.0 in | Wt 243.0 lb

## 2017-04-02 DIAGNOSIS — Z7901 Long term (current) use of anticoagulants: Secondary | ICD-10-CM

## 2017-04-02 DIAGNOSIS — Z79899 Other long term (current) drug therapy: Secondary | ICD-10-CM | POA: Diagnosis not present

## 2017-04-02 DIAGNOSIS — I483 Typical atrial flutter: Secondary | ICD-10-CM | POA: Diagnosis not present

## 2017-04-02 MED ORDER — AMIODARONE HCL 200 MG PO TABS: 200.0000 mg | ORAL_TABLET | Freq: Every day | ORAL | 3 refills | Status: DC

## 2017-04-02 NOTE — Progress Notes (Signed)
1   Cardiology Office Note    Date:  04/02/2017   ID:  Marvin Simmons, DOB 1960-10-08, MRN [id number]  PCP:  [facility]  Cardiologist: Marvin Noe, MD   Chief Complaint  Patient presents with  . Atrial Fibrillation    History of Present Illness:  Marvin Simmons is a 57 y.o. 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  This occurred in 2017.   He has significantly decreased his drinking.  He had successful electrical cardioversion on January 11.  He returns today for follow-up.  Still on amiodarone 200 mg/day.  He does not have fluttering in his chest.  Perhaps his endurance is slightly improved.  He does not snores loudly at night now that he is not drinking alcohol each evening.   Past Medical History:  Diagnosis Date  . Atrial flutter (HCC) 2017  . Morbid obesity (HCC) 12/31/2016    Past Surgical History:  Procedure Laterality Date  . CARDIOVERSION N/A 03/07/2017   Procedure: CARDIOVERSION;  Surgeon: Thurmon Fair, MD;  Location: [facility];  Service: Cardiovascular;  Laterality: N/A;  . SHOULDER SURGERY Right   . TEE WITHOUT CARDIOVERSION N/A 03/07/2017   Procedure: TRANSESOPHAGEAL ECHOCARDIOGRAM (TEE);  Surgeon: Thurmon Fair, MD;  Location: [facility];  Service: Cardiovascular;  Laterality: N/A;  . TONSILLECTOMY      Current Medications: Outpatient Medications Prior to Visit  Medication Sig Dispense Refill  . albuterol (PROAIR HFA) 108 (90 Base) MCG/ACT inhaler Inhale 2 puffs into the lungs every 6 (six) hours as needed for wheezing or shortness of breath.     Marland Kitchen amiodarone (PACERONE) 200 MG tablet Take 1 tablet (200 mg total) by mouth 2 (two) times daily. 180 tablet 3  . apixaban (ELIQUIS) 5 MG TABS tablet Take 1 tablet (5 mg total) by mouth 2 (two) times daily. 180 tablet 3  . ibuprofen  (ADVIL,MOTRIN) 200 MG tablet Take 400 mg by mouth every 6 (six) hours as needed for headache or moderate pain.    . metoprolol succinate (TOPROL-XL) 25 MG 24 hr tablet Take 2 tablets (50 mg total) by mouth daily. 180 tablet 3  . mometasone (NASONEX) 50 MCG/ACT nasal spray Place 2 sprays into the nose 2 (two) times daily as needed (for allergies).     . sildenafil (VIAGRA) 50 MG tablet Take 50 mg by mouth daily as needed for erectile dysfunction.    Marland Kitchen testosterone cypionate (DEPOTESTOSTERONE CYPIONATE) 200 MG/ML injection Inject 200 mg into the muscle every 14 (fourteen) days.     No facility-administered medications prior to visit.      Allergies:   Patient has no known allergies.   Social History   Socioeconomic History  . Marital status: Married    Spouse name: None  . Number of children: None  . Years of education: None  . Highest education level: None  Social Needs  . Financial resource strain: None  . Food insecurity - worry: None  . Food insecurity - inability: None  . Transportation needs - medical: None  . Transportation needs - non-medical: None  Occupational History  . None  Tobacco Use  . Smoking status: Former Smoker    Last attempt to quit: 09/18/1990    Years since quitting: 26.5  . Smokeless tobacco: Former Neurosurgeon    Types: Snuff    Quit date: 09/18/1990  Substance and Sexual Activity  . Alcohol use: Yes    Comment: avg 4oz alcohol/day  . Drug use: No  . Sexual activity: None  Other Topics Concern  . None  Social History Narrative  . None     Family History:  The patient's family history includes Emphysema in his mother; Lactose intolerance in his father.   ROS:   Please see the history of present illness.    Sleep study was unremarkable and did not reveal sleep apnea. All other systems reviewed and are negative.   PHYSICAL EXAM:   VS:  BP 112/70   Pulse 62   Ht 5\' 6"  (1.676 m)   Wt 243 lb (110.2 kg)   BMI 39.22 kg/m    GEN: Well nourished, well  developed, in no acute distress.  Obese. HEENT: normal  Neck: no JVD, carotid bruits, or masses Cardiac: RRR; no murmurs, rubs, or gallops,no edema  Respiratory:  clear to auscultation bilaterally, normal work of breathing GI: soft, nontender, nondistended, + BS MS: no deformity or atrophy  Skin: warm and dry, no rash Neuro:  Alert and Oriented x 3, Strength and sensation are intact Psych: euthymic mood, full affect  Wt Readings from Last 3 Encounters:  04/02/17 243 lb (110.2 kg)  02/26/17 252 lb 12.8 oz (114.7 kg)  02/05/17 250 lb (113.4 kg)      Studies/Labs Reviewed:   EKG:  EKG sinus rhythm at 62 bpm.  Nonspecific T wave flattening.  Recent Labs: 02/26/2017: BUN 14; Creatinine, Ser 1.05; Hemoglobin 17.3; Platelets 250; Potassium 4.7; Sodium 142   Lipid Panel No results found for: CHOL, TRIG, HDL, CHOLHDL, VLDL, LDLCALC, LDLDIRECT  Additional studies/ records that were reviewed today include:  Transesophageal echocardiogram 03/07/2017    Study Conclusions   - Left ventricle: The cavity size was normal. Wall thickness was   normal. Systolic function was mildly reduced. The estimated   ejection fraction was in the range of 45% to 50%. Mild diffuse   hypokinesis. - Left atrium: No evidence of thrombus in the atrial cavity or   appendage. - Right atrium: The atrium was moderately dilated. No evidence of   thrombus in the atrial cavity or appendage.    ASSESSMENT:    1. Typical atrial flutter (HCC)   2. Anticoagulation adequate      PLAN:  In order of problems listed above:  1. EKG demonstrates normal sinus rhythm as noted above.  Decrease amiodarone to 200 mg daily.  Will be continued for another 3 months and then discontinued.  If no recurrence of atrial fibrillation 6 months after that we will likely discontinue apixaban. 2. Continue apixaban at the current dose.  On return we need to consider ordering an echocardiogram, checking liver and thyroid function  studies, and monitoring hemoglobin.  Goal is eventual discontinuation of amiodarone and subsequently if no recurrence of atrial fibrillation, anticoagulation therapy.    Medication Adjustments/Labs and Tests Ordered: Current medicines are reviewed at length with the patient today.  Concerns regarding medicines are outlined above.  Medication changes, Labs and Tests ordered today are listed in the Patient Instructions below. There are no Patient Instructions on file for this visit.   Signed, Marvin NoeHenry W Bralen Wiltgen III, MD  04/02/2017 4:54 PM    [facility] [address], [place], [place]  [address] Phone: [phone]; Fax: [phone]

## 2017-04-02 NOTE — Patient Instructions (Signed)
Medication Instructions:  1) DECREASE Amiodarone 200mg  once daily  Labwork: None  Testing/Procedures: None  Follow-Up: Your physician recommends that you schedule a follow-up appointment in: 3 months with Dr. Katrinka BlazingSmith with an EKG.    Any Other Special Instructions Will Be Listed Below (If Applicable).     If you need a refill on your cardiac medications before your next appointment, please call your pharmacy.

## 2017-06-30 NOTE — Progress Notes (Signed)
Cardiology Office Note    Date:  07/01/2017   ID:  Marvin Simmons, DOB November 25, 1960, MRN [id number]  PCP:  [facility]  Cardiologist: Lesleigh Noe, MD   Chief Complaint  Patient presents with  . Atrial Fibrillation    History of Present Illness:  Marvin Simmons is a 57 y.o. male follow up of paroxysmal atrial flutter treated with amiodarone and cardioversion.  He has been diligent with attempts to lose weight.  He is enrolled in a program through [facility].  He has lost greater than 40 pounds over the past 3 months.  No recurrence of weakness, palpitations, dyspnea, or edema.  He denies side effects to his current medical regimen.  Past Medical History:  Diagnosis Date  . Atrial flutter (HCC) 2017  . Morbid obesity (HCC) 12/31/2016    Past Surgical History:  Procedure Laterality Date  . CARDIOVERSION N/A 03/07/2017   Procedure: CARDIOVERSION;  Surgeon: Thurmon Fair, MD;  Location: [facility] ENDOSCOPY;  Service: Cardiovascular;  Laterality: N/A;  . SHOULDER SURGERY Right   . TEE WITHOUT CARDIOVERSION N/A 03/07/2017   Procedure: TRANSESOPHAGEAL ECHOCARDIOGRAM (TEE);  Surgeon: Thurmon Fair, MD;  Location: [facility] ENDOSCOPY;  Service: Cardiovascular;  Laterality: N/A;  . TONSILLECTOMY      Current Medications: Outpatient Medications Prior to Visit  Medication Sig Dispense Refill  . albuterol (PROAIR HFA) 108 (90 Base) MCG/ACT inhaler Inhale 2 puffs into the lungs every 6 (six) hours as needed for wheezing or shortness of breath.     Marland Kitchen apixaban (ELIQUIS) 5 MG TABS tablet Take 1 tablet (5 mg total) by mouth 2 (two) times daily. 180 tablet 3  . ibuprofen (ADVIL,MOTRIN) 200 MG tablet Take 400 mg by mouth every 6 (six) hours as needed for headache or moderate pain.    . metoprolol succinate (TOPROL-XL) 25 MG 24 hr tablet Take 2 tablets (50 mg total) by mouth daily. 180 tablet 3  . mometasone (NASONEX) 50 MCG/ACT nasal  spray Place 2 sprays into the nose 2 (two) times daily as needed (for allergies).     . sildenafil (VIAGRA) 50 MG tablet Take 50 mg by mouth daily as needed for erectile dysfunction.    Marland Kitchen testosterone cypionate (DEPOTESTOSTERONE CYPIONATE) 200 MG/ML injection Inject 200 mg into the muscle every 14 (fourteen) days.    Marland Kitchen amiodarone (PACERONE) 200 MG tablet Take 1 tablet (200 mg total) by mouth daily. 90 tablet 3   No facility-administered medications prior to visit.      Allergies:   Patient has no known allergies.   Social History   Socioeconomic History  . Marital status: Married    Spouse name: Not on file  . Number of children: Not on file  . Years of education: Not on file  . Highest education level: Not on file  Occupational History  . Not on file  Social Needs  . Financial resource strain: Not on file  . Food insecurity:    Worry: Not on file    Inability: Not on file  . Transportation needs:    Medical: Not on file    Non-medical: Not on file  Tobacco Use  . Smoking status: Former Smoker    Last attempt to quit: 09/18/1990    Years since quitting: 26.8  . Smokeless tobacco: Former Neurosurgeon    Types: Snuff    Quit date: 09/18/1990  Substance and Sexual Activity  . Alcohol use: Yes  Comment: avg 4oz alcohol/day  . Drug use: No  . Sexual activity: Not on file  Lifestyle  . Physical activity:    Days per week: Not on file    Minutes per session: Not on file  . Stress: Not on file  Relationships  . Social connections:    Talks on phone: Not on file    Gets together: Not on file    Attends religious service: Not on file    Active member of club or organization: Not on file    Attends meetings of clubs or organizations: Not on file    Relationship status: Not on file  Other Topics Concern  . Not on file  Social History Narrative  . Not on file     Family History:  The patient's family history includes Emphysema in his mother; Lactose intolerance in his father.    ROS:   Please see the history of present illness.    Wants to be off of all medications if possible. All other systems reviewed and are negative.   PHYSICAL EXAM:   VS:  BP 124/74   Pulse 64   Ht  (1.702 m)   Wt 213 lb 12.8 oz (97 kg)   BMI 33.49 kg/m    GEN: Well nourished, well developed, in no acute distress.  Significant weight loss compared to January when he weighed 252 pounds. HEENT: normal  Neck: no JVD, carotid bruits, or masses Cardiac: RRR; no murmurs, rubs, or gallops,no edema  Respiratory:  clear to auscultation bilaterally, normal work of breathing GI: soft, nontender, nondistended, + BS MS: no deformity or atrophy  Skin: warm and dry, no rash Neuro:  Alert and Oriented x 3, Strength and sensation are intact Psych: euthymic mood, full affect  Wt Readings from Last 3 Encounters:  07/01/17 213 lb 12.8 oz (97 kg)  04/02/17 243 lb (110.2 kg)  02/26/17 252 lb 12.8 oz (114.7 kg)      Studies/Labs Reviewed:   EKG:  EKG  Not repeated  Recent Labs: 02/26/2017: BUN 14; Creatinine, Ser 1.05; Hemoglobin 17.3; Platelets 250; Potassium 4.7; Sodium 142   Lipid Panel No results found for: CHOL, TRIG, HDL, CHOLHDL, VLDL, LDLCALC, LDLDIRECT  Additional studies/ records that were reviewed today include:  None    ASSESSMENT:    1. Typical atrial flutter (HCC)   2. Anticoagulation adequate      PLAN:  In order of problems listed above:  1. Discontinue amiodarone.  Clinical follow-up in 3 months with an EKG.  Consider discontinuation of anticoagulation at that time if no recurrent atrial fibrillation/flutter is noted.  Consider 24 to 48-hour monitoring. 2. Continue apixaban 5 mg twice daily.  Aerobic exercise, continue dieting, call if dyspnea or palpitations.  Continue to abstain from alcohol intake.    Medication Adjustments/Labs and Tests Ordered: Current medicines are reviewed at length with the patient today.  Concerns regarding medicines are  outlined above.  Medication changes, Labs and Tests ordered today are listed in the Patient Instructions below. Patient Instructions  Medication Instructions:  1) DISCONTINUE Amiodarone  Labwork: None  Testing/Procedures: None  Follow-Up: Your physician recommends that you schedule a follow-up appointment in: 3 months with Dr. Katrinka Blazing. (Can have 8/16 at 11:40A)   Any Other Special Instructions Will Be Listed Below (If Applicable).     If you need a refill on your cardiac medications before your next appointment, please call your pharmacy.      Signed, Lyn Records III,  MD  07/01/2017 4:55 PM    [facility] [address] [place], [place], [place]  [address] Phone: [phone]; Fax: [phone]

## 2017-07-01 ENCOUNTER — Ambulatory Visit: Payer: BC Managed Care – PPO | Admitting: Interventional Cardiology

## 2017-07-01 ENCOUNTER — Encounter: Payer: Self-pay | Admitting: Interventional Cardiology

## 2017-07-01 VITALS — BP 124/74 | HR 64 | Ht 67.0 in | Wt 213.8 lb

## 2017-07-01 DIAGNOSIS — Z7901 Long term (current) use of anticoagulants: Secondary | ICD-10-CM | POA: Diagnosis not present

## 2017-07-01 DIAGNOSIS — I483 Typical atrial flutter: Secondary | ICD-10-CM | POA: Diagnosis not present

## 2017-07-01 NOTE — Patient Instructions (Addendum)
Medication Instructions:  1) DISCONTINUE Amiodarone  Labwork: None  Testing/Procedures: None  Follow-Up: Your physician recommends that you schedule a follow-up appointment in: 3 months with Dr. Katrinka Blazing. (Can have 8/16 at 11:40A)   Any Other Special Instructions Will Be Listed Below (If Applicable).     If you need a refill on your cardiac medications before your next appointment, please call your pharmacy.

## 2017-07-29 ENCOUNTER — Telehealth: Payer: Self-pay | Admitting: Interventional Cardiology

## 2017-07-29 NOTE — Telephone Encounter (Signed)
Spoke with Nicholaus BloomKelley, PharmD and she said ok to use NSAID at lowest dose possible for shortest time period possible and monitor for bleeding.  Spoke with pt's wife, DPR on file.  Advised her of recommendations.  Wife verbalized understanding and was in agreement with this plan.

## 2017-07-29 NOTE — Telephone Encounter (Signed)
New Message   Pt c/o medication issue:  1. Name of Medication: apixaban (ELIQUIS) 5 MG TABS tablet and metoprolol succinate (TOPROL-XL) 25 MG 24 hr tablet  2. How are you currently taking this medication (dosage and times per day)? Take 1 tablet (5 mg total) by mouth 2 (two) times daily. And Take 2 tablets (50 mg total) by mouth daily  3. Are you having a reaction (difficulty breathing--STAT)? no  4. What is your medication issue? Pt's wife states the pt sprained his ankle and wants to know what kind of anti inflammatory he can take with these medication. Please all

## 2017-08-06 IMAGING — CR DG CHEST 2V
2 series · 2 of 2 positions shown · non-contrast
Comparison: 02/14/2006

CLINICAL DATA: Pt had palpitations starting around 4799 today. Has
had similar episodes when trying to go to sleep x 1-2 months. Denies
shortness of breath, n/v, dizziness. Does report having tightness in
lower throat region

EXAM:
CHEST  2 VIEW

[chest pa]
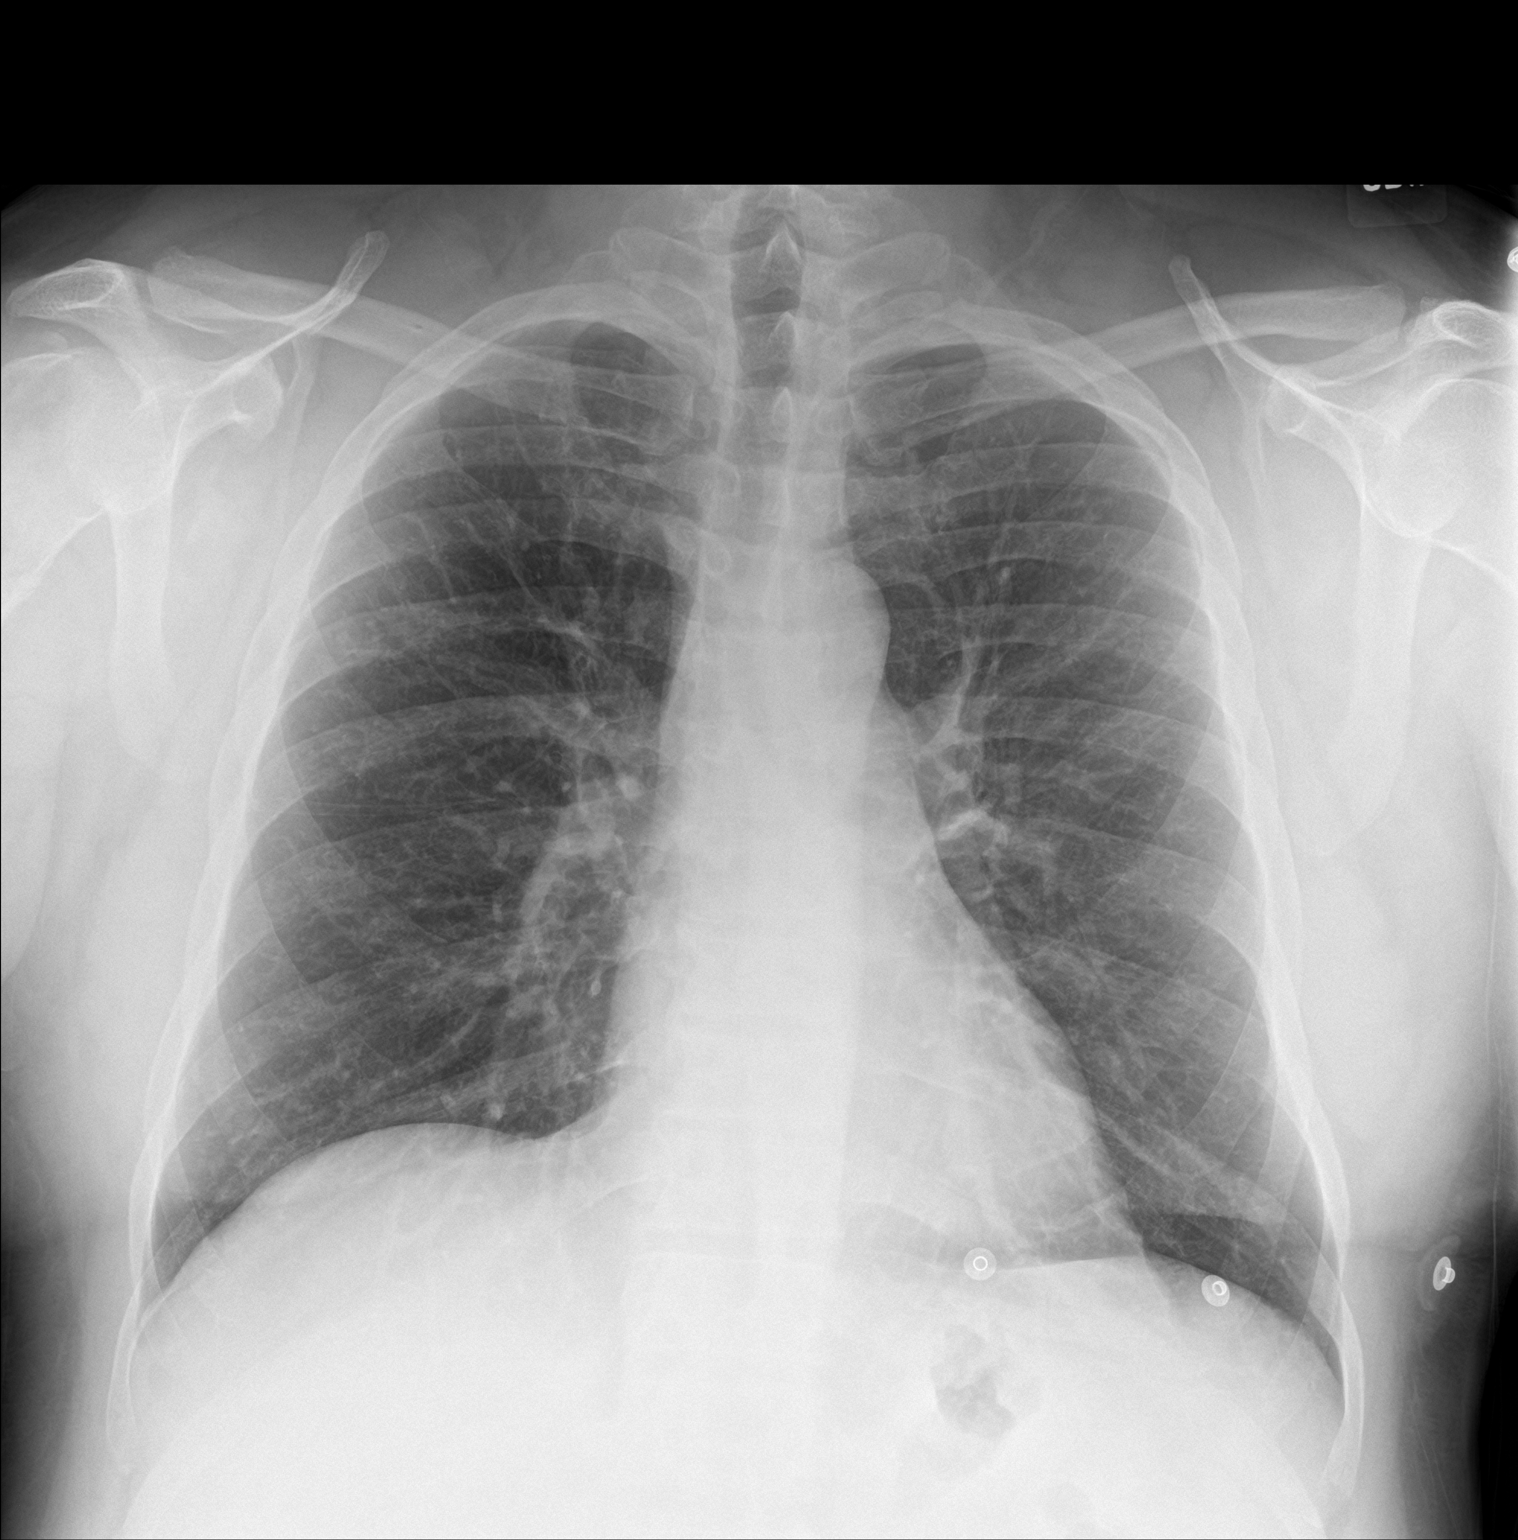

[chest lat]
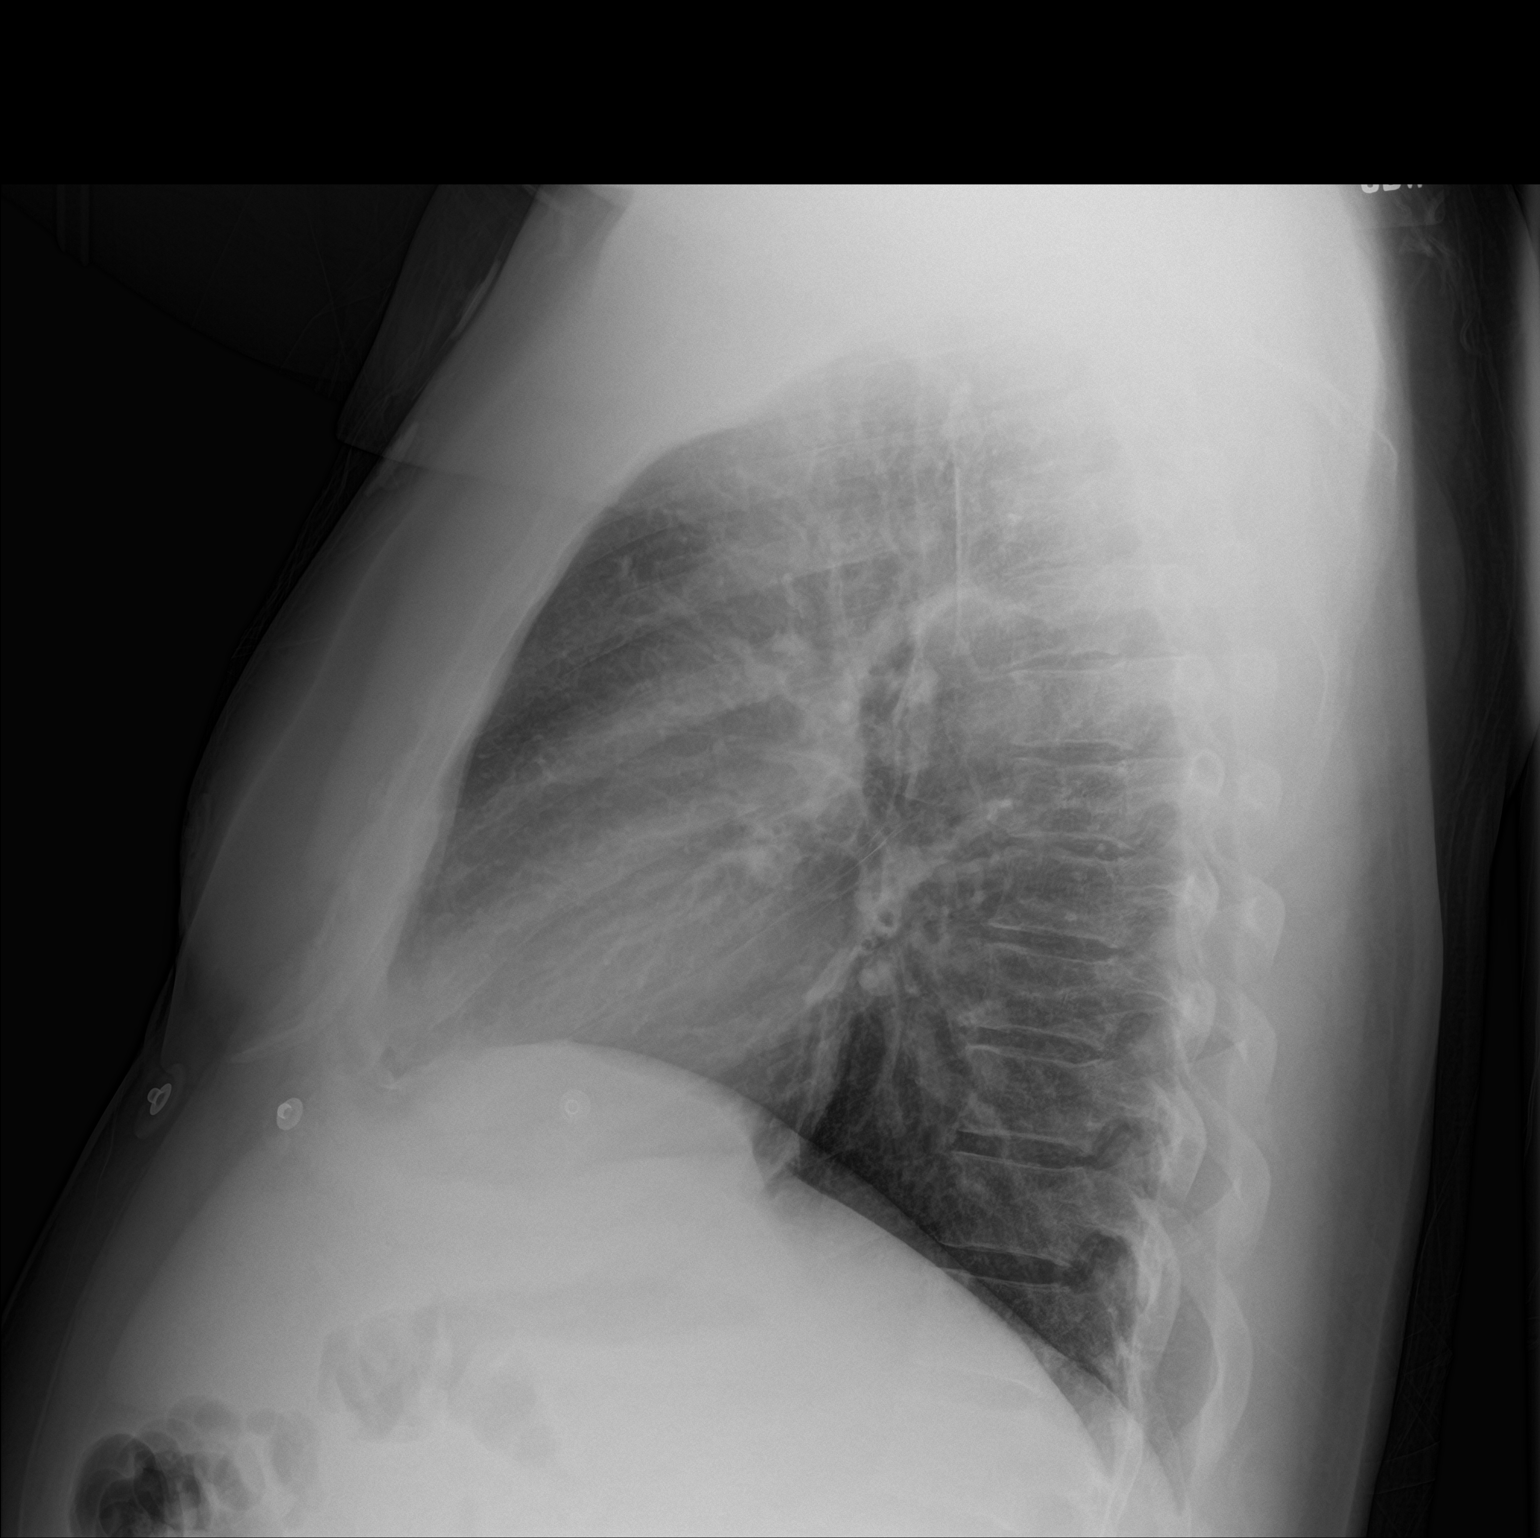

[2 of 2 positions shown; findings below may reference images not displayed]

FINDINGS: The heart size and mediastinal contours are within normal limits.
Both lungs are clear. No pleural effusion or pneumothorax. The
visualized skeletal structures are unremarkable.
IMPRESSION: No active cardiopulmonary disease.

## 2017-10-10 ENCOUNTER — Encounter: Payer: Self-pay | Admitting: Interventional Cardiology

## 2017-10-10 ENCOUNTER — Ambulatory Visit: Payer: BC Managed Care – PPO | Admitting: Interventional Cardiology

## 2017-10-10 VITALS — BP 96/66 | HR 54 | Ht 66.5 in | Wt 195.6 lb

## 2017-10-10 DIAGNOSIS — Z79899 Other long term (current) drug therapy: Secondary | ICD-10-CM | POA: Diagnosis not present

## 2017-10-10 DIAGNOSIS — I483 Typical atrial flutter: Secondary | ICD-10-CM | POA: Diagnosis not present

## 2017-10-10 DIAGNOSIS — Z7901 Long term (current) use of anticoagulants: Secondary | ICD-10-CM | POA: Diagnosis not present

## 2017-10-10 NOTE — Patient Instructions (Signed)

## 2017-10-10 NOTE — Progress Notes (Signed)
Cardiology Office Note:    Date:  10/10/2017   ID:  Marvin Simmons, DOB 1960/08/22, MRN [id number]  [facility]  [facility]  Cardiologist:  No primary care provider on file.   Referring MD: [place] [place], [place]   Chief Complaint  Patient presents with  . Atrial Fibrillation  . Hypertension    History of Present Illness:    Marvin Simmons is a 57 y.o. male with a hx of paroxysmal atrial flutter treated with amiodarone and cardioversion.  Marvin Simmons seems to be doing well.  He is accompanied by his wife.  He is maintained better control of his weight.  He has had occasional episodes when he has consumed alcohol on a daily basis.  One such instance occurred 2 weeks ago when he was on vacation for a week.  The following week he noted that his heart rate was generally higher and somewhere between 80 and 100 bpm.  He did not feel poorly.  No palpitations occurred.  Amiodarone was discontinued 3 months ago.  He has been on anticoagulation therapy.  He denies neurological complaints.    Past Medical History:  Diagnosis Date  . Atrial flutter (HCC) 2017  . Morbid obesity (HCC) 12/31/2016    Past Surgical History:  Procedure Laterality Date  . CARDIOVERSION N/A 03/07/2017   Procedure: CARDIOVERSION;  Surgeon: Thurmon Fairroitoru, Mihai, MD;  Location: [facility] ENDOSCOPY;  Service: Cardiovascular;  Laterality: N/A;  . SHOULDER SURGERY Right   . TEE WITHOUT CARDIOVERSION N/A 03/07/2017   Procedure: TRANSESOPHAGEAL ECHOCARDIOGRAM (TEE);  Surgeon: Thurmon Fairroitoru, Mihai, MD;  Location: [facility] ENDOSCOPY;  Service: Cardiovascular;  Laterality: N/A;  . TONSILLECTOMY      Current Medications: Current Meds  Medication Sig  . albuterol (PROAIR HFA) 108 (90 Base) MCG/ACT inhaler Inhale 2 puffs into the lungs every 6 (six) hours as needed for wheezing or shortness of breath.   Marland Kitchen. apixaban (ELIQUIS) 5 MG TABS tablet Take 1 tablet (5 mg total) by mouth 2 (two) times daily.  Marland Kitchen. ibuprofen  (ADVIL,MOTRIN) 200 MG tablet Take 400 mg by mouth every 6 (six) hours as needed for headache or moderate pain.  . metoprolol succinate (TOPROL-XL) 25 MG 24 hr tablet Take 2 tablets (50 mg total) by mouth daily.  . mometasone (NASONEX) 50 MCG/ACT nasal spray Place 2 sprays into the nose 2 (two) times daily as needed (for allergies).   . sildenafil (VIAGRA) 50 MG tablet Take 50 mg by mouth daily as needed for erectile dysfunction.  Marland Kitchen. testosterone cypionate (DEPOTESTOSTERONE CYPIONATE) 200 MG/ML injection Inject 200 mg into the muscle every 14 (fourteen) days.     Allergies:   Patient has no known allergies.   Social History   Socioeconomic History  . Marital status: Married    Spouse name: Not on file  . Number of children: Not on file  . Years of education: Not on file  . Highest education level: Not on file  Occupational History  . Not on file  Social Needs  . Financial resource strain: Not on file  . Food insecurity:    Worry: Not on file    Inability: Not on file  . Transportation needs:    Medical: Not on file    Non-medical: Not on file  Tobacco Use  . Smoking status: Former Smoker    Last attempt to quit: 09/18/1990    Years since quitting: 27.0  . Smokeless tobacco: Former NeurosurgeonUser    Types: Snuff    Quit date: 09/18/1990  Substance and Sexual Activity  . Alcohol use: Yes    Comment: avg 4oz alcohol/day  . Drug use: No  . Sexual activity: Not on file  Lifestyle  . Physical activity:    Days per week: Not on file    Minutes per session: Not on file  . Stress: Not on file  Relationships  . Social connections:    Talks on phone: Not on file    Gets together: Not on file    Attends religious service: Not on file    Active member of club or organization: Not on file    Attends meetings of clubs or organizations: Not on file    Relationship status: Not on file  Other Topics Concern  . Not on file  Social History Narrative  . Not on file     Family History: The  patient's family history includes Emphysema in his mother; Lactose intolerance in his father.  ROS:   Please see the history of present illness.    Anxiety, constipation, alcohol use although "not as heavy as previous".  All other systems reviewed and are negative.  EKGs/Labs/Other Studies Reviewed:    The following studies were reviewed today: Heart rate and blood pressure data from his apple watch app demonstrates that during the timeframe mentioned above he had great variability in heart rate and generally higher than prior and subsequent to resolution.  Suspect he may have been in atrial fibrillation during this time.  EKG:  EKG is  ordered today.  The ekg ordered today demonstrates sinus bradycardia with biatrial abnormality.  Recent Labs: 02/26/2017: BUN 14; Creatinine, Ser 1.05; Hemoglobin 17.3; Platelets 250; Potassium 4.7; Sodium 142  Recent Lipid Panel No results found for: CHOL, TRIG, HDL, CHOLHDL, VLDL, LDLCALC, LDLDIRECT  Physical Exam:    VS:  There were no vitals taken for this visit.    Wt Readings from Last 3 Encounters:  07/01/17 213 lb 12.8 oz (97 kg)  04/02/17 243 lb (110.2 kg)  02/26/17 252 lb 12.8 oz (114.7 kg)     GEN: Ready skin complexion.  Well nourished, well developed in no acute distress HEENT: Normal NECK: No JVD. LYMPHATICS: No lymphadenopathy CARDIAC: RRR, no murmur, no gallop, no edema. VASCULAR: 2+ bilateral radial pulses.  No bruits. RESPIRATORY:  Clear to auscultation without rales, wheezing or rhonchi  ABDOMEN: Soft, non-tender, non-distended, No pulsatile mass, MUSCULOSKELETAL: No deformity  SKIN: Warm and dry NEUROLOGIC:  Alert and oriented x 3 PSYCHIATRIC:  Normal affect   ASSESSMENT:    1. Typical atrial flutter (HCC)   2. Anticoagulation adequate   3. On amiodarone therapy    PLAN:    In order of problems listed above:  1. Stable sinus rhythm.  Suspect intermittent atrial flutter 2 weeks ago when he was drinking alcohol and  when heart rates were increased on his iPhone APP.  Continue current therapy.  Cannot discontinue anticoagulation therapy.  Continue to monitor blood pressures and heart rate and rhythm with the iPhone app. 2. Continue Eliquis indefinitely. 3. Discontinue amiodarone.  Consider ablation if recurrent sustained atrial flutter.  To discontinue alcohol.  Clinical follow-up in 6 months.  Notify us if persistent abnormal heart rate and rhythm.   Medication Adjustments/Labs and Tests Ordered: Current medicines are reviewed at length with the patient today.  Concerns regarding medicines are outlined above.  No orders of the defined types were placed in this encounter.  No orders of the defined types were placed in this encounter.  There are no Patient Instructions on file for this visit.   Signed, Lesleigh Noe, MD  10/10/2017 11:44 AM    [facility]

## 2017-12-04 ENCOUNTER — Encounter: Payer: Self-pay | Admitting: Cardiology

## 2017-12-04 ENCOUNTER — Ambulatory Visit: Payer: BC Managed Care – PPO | Admitting: Cardiology

## 2017-12-04 ENCOUNTER — Telehealth: Payer: Self-pay | Admitting: Interventional Cardiology

## 2017-12-04 VITALS — BP 102/68 | HR 78 | Ht 66.5 in | Wt 190.8 lb

## 2017-12-04 DIAGNOSIS — I483 Typical atrial flutter: Secondary | ICD-10-CM

## 2017-12-04 MED ORDER — METOPROLOL SUCCINATE ER 50 MG PO TB24: 50.0000 mg | ORAL_TABLET | Freq: Two times a day (BID) | ORAL | 3 refills | Status: DC

## 2017-12-04 NOTE — Patient Instructions (Addendum)
Medication Instructions:  INCREASE: Toprol to 50 mg twice a day  If you need a refill on your cardiac medications before your next appointment, please call your pharmacy.   Lab work: FUTURE: BMET & CBC  Testing/Procedures: Dear Marvin Simmons  You are scheduled for a TEE/Cardioversion/TEE Cardioversion on 12/29/17 with Dr. Delton See.  Please arrive at the [facility] (Main Entrance A) at [facility]: [address] [place], [place] [address] at 7:30 am. (1 hour prior to procedure)  DIET: Nothing to eat or drink after midnight except a sip of water with medications (see medication instructions below)  Medication Instructions: Do not take your viagra the night before your procedure  Continue your anticoagulant:  ELIQUIS  You will need to continue your anticoagulant after your procedure until you are told by your  Provider that it is safe to stop   Labs: If patient is on Coumadin, patient needs pt/INR, CBC, BMET within 3 days (No pt/INR needed for patients taking Xarelto, Eliquis, Pradaxa) For patients receiving anesthesia for TEE and all Cardioversion patients: BMET, CBC within 1 week   Come to the lab at [facility] between the hours of 8:00 am and 4:30 pm. You do not have to be fasting on 12/22/17   You must have a responsible person to drive you home and stay in the waiting area during your procedure. Failure to do so could result in cancellation.  Bring your insurance cards.  *Special Note: Every effort is made to have your procedure done on time. Occasionally there are emergencies that occur at the hospital that may cause delays. Please be patient if a delay does occur.    Follow-Up: You have been referred for a EP consult for Aflutter  Follow up Lizabeth Leyden NP, after cardioversion   Any Other Special Instructions Will Be Listed Below (If Applicable).

## 2017-12-04 NOTE — Telephone Encounter (Signed)
New Message   Pt states that he is calling back because he forgot to ask the nurse some important questions regarding the situation that was discussed. Please call

## 2017-12-04 NOTE — Telephone Encounter (Signed)
Pt states since Monday his HR has been jumping around, especially when he gets out of bed it will go from 60 to 90.  Stays up the rest of the day and will get as high as 150 during exercise.  Pt was out for his exercising Tuesday, ran two 6 minutes intervals with walking inbetween, stopped to look at his watch and suddenly felt light headed and passed out.  States he was only down for about 15 secs and got up and was able to finish his walk.  Did not hit head.  Pt did not go to work today because he doesn't feel well.  HR while on the phone was 60, but pt states it was 90 earlier.  Advised I would run all of this by Dr. Katrinka Blazing and call him back.  Pt appreciative for call.

## 2017-12-04 NOTE — Telephone Encounter (Signed)
New message   Pt c/o medication issue:  1. Name of Medication    amlodipine  2. How are you currently taking this medication (dosage and times per day)? 2 pills once daily  3. Are you having a reaction (difficulty breathing--STAT)? No   4. What is your medication issue? Per patient states that his hr is 90 laying down. Patient states that when he wakes up his hr is 50. Patient states that as soon he gets up in the mornings it jumps up to 90.

## 2017-12-04 NOTE — Telephone Encounter (Signed)
Spoke with Dr. Katrinka Blazing and he said have the pt come in and be seen today.  Scheduled pt to see Lizabeth Leyden, NP at 11A.  Pt appreciative for call.

## 2017-12-04 NOTE — Progress Notes (Signed)
Cardiology Office Note:    Date:  12/04/2017   ID:  Marvin Simmons, DOB [birth year], MRN [id number]  PCP:  [facility]  Cardiologist:  Lesleigh Noe, MD  Referring MD: Roe Coombs*   Chief Complaint  Patient presents with  . Atrial Flutter    History of Present Illness:    Marvin Simmons is a 57 y.o. male with a past medical history significant for paroxysmal atrial flutter s/p cardioversion in 02/2017, on apixaban, obesity  [name] was first diagnosed with atrial flutter in 2017 after he noticed intermittent heart rate of greater than 130 bpm.  Sleep study was negative for sleep apnea.  Initially not anticoagulated due to This patients CHA2DS2-VASc score of 0.  He was maintaining sinus rhythm on beta-blocker until he had a recurrence of a flutter in 12/2016 in the setting of poor medication compliance. After finding poor control on 48-hour monitor the patient was started on amiodarone and Eliquis and continued on metoprolol in 01/2017.  At follow-up in 02/2017 the patient continued in a flutter with a rate of 89.  He subsequently underwent successful DC cardioversion to sinus rhythm. He maintained sinus rhythm and amiodarone was discontinued in 06/2017.  On Monday sitting at desk at work he noted an odd feeling in his chest, possibly fluttering, tightness in the upper chest. He had been hiking all day on Sunday. Upon review of his Apple watch HR was around 100 all day on Monday and Tuesday. This morning upon awakening HR was around 50 then went up 100 with normal activity. His normal HR is about 70's. He has had lighhtheadedness Tuesday when he went running outside and had near syncope with going to the ground.   He admits that he had forgotten to take his medicines all weekend and had been drinking alcohol heavily over the weekend. No metoprolol or Eliquis. He has been taking both since Monday and continues to be in a-flutter.   He has cut  out alcohol during the week but still drinks several drinks, 4-6 or more on Friday-Sunday.   Past Medical History:  Diagnosis Date  . Atrial flutter (HCC) 2017  . Morbid obesity (HCC) 12/31/2016    Past Surgical History:  Procedure Laterality Date  . CARDIOVERSION N/A 03/07/2017   Procedure: CARDIOVERSION;  Surgeon: Thurmon Fair, MD;  Location: [facility];  Service: Cardiovascular;  Laterality: N/A;  . SHOULDER SURGERY Right   . TEE WITHOUT CARDIOVERSION N/A 03/07/2017   Procedure: TRANSESOPHAGEAL ECHOCARDIOGRAM (TEE);  Surgeon: Thurmon Fair, MD;  Location: [facility];  Service: Cardiovascular;  Laterality: N/A;  . TONSILLECTOMY      Current Medications: Current Meds  Medication Sig  . albuterol (PROAIR HFA) 108 (90 Base) MCG/ACT inhaler Inhale 2 puffs into the lungs every 6 (six) hours as needed for wheezing or shortness of breath.   Marland Kitchen apixaban (ELIQUIS) 5 MG TABS tablet Take 1 tablet (5 mg total) by mouth 2 (two) times daily.  Marland Kitchen ibuprofen (ADVIL,MOTRIN) 200 MG tablet Take 400 mg by mouth every 6 (six) hours as needed for headache or moderate pain.  . metoprolol succinate (TOPROL-XL) 50 MG 24 hr tablet Take 1 tablet (50 mg total) by mouth 2 (two) times daily.  . mometasone (NASONEX) 50 MCG/ACT nasal spray Place 2 sprays into the nose 2 (two) times daily as needed (for allergies).   . sildenafil (VIAGRA) 50 MG tablet Take 50 mg by mouth daily as needed for erectile dysfunction.  Marland Kitchen  testosterone cypionate (DEPOTESTOSTERONE CYPIONATE) 200 MG/ML injection Inject 200 mg into the muscle every 14 (fourteen) days.  . [DISCONTINUED] metoprolol succinate (TOPROL-XL) 25 MG 24 hr tablet Take 2 tablets (50 mg total) by mouth daily.     Allergies:   Patient has no known allergies.   Social History   Socioeconomic History  . Marital status: Married    Spouse name: Not on file  . Number of children: Not on file  . Years of education: Not on file  . Highest education level: Not on file   Occupational History  . Not on file  Social Needs  . Financial resource strain: Not on file  . Food insecurity:    Worry: Not on file    Inability: Not on file  . Transportation needs:    Medical: Not on file    Non-medical: Not on file  Tobacco Use  . Smoking status: Former Smoker    Last attempt to quit: 09/18/1990    Years since quitting: 27.2  . Smokeless tobacco: Former Neurosurgeon    Types: Snuff    Quit date: 09/18/1990  Substance and Sexual Activity  . Alcohol use: Yes    Comment: avg 4oz alcohol/day  . Drug use: No  . Sexual activity: Not on file  Lifestyle  . Physical activity:    Days per week: Not on file    Minutes per session: Not on file  . Stress: Not on file  Relationships  . Social connections:    Talks on phone: Not on file    Gets together: Not on file    Attends religious service: Not on file    Active member of club or organization: Not on file    Attends meetings of clubs or organizations: Not on file    Relationship status: Not on file  Other Topics Concern  . Not on file  Social History Narrative  . Not on file     Family History: The patient's family history includes Emphysema in his mother; Lactose intolerance in his father. ROS:   Please see the history of present illness.     All other systems reviewed and are negative.  EKGs/Labs/Other Studies Reviewed:    The following studies were reviewed today:  Echocardiogram 10/02/15 Study Conclusions  - Left ventricle: The cavity size was normal. There was mild focal basal hypertrophy of the septum. Systolic function was normal. The estimated ejection fraction was in the range of 55% to 60%. Wall motion was normal; there were no regional wall motion abnormalities. Left ventricular diastolic function parameters were normal. - Aortic valve: Transvalvular velocity was within the normal range. There was no stenosis. There was no regurgitation. - Mitral valve: Transvalvular velocity was  within the normal range. There was no evidence for stenosis. There was no regurgitation. - Right ventricle: The cavity size was normal. Wall thickness was normal. Systolic function was normal. - Tricuspid valve: There was physiologic regurgitation. - Pulmonary arteries: Systolic pressure was within the normal range. PA peak pressure: 26 mm Hg (S).  Sleep study 11/27/15: IMPRESSIONS - No significant obstructive sleep apnea occurred during this study (AHI = 3.8/h). - No significant central sleep apnea occurred during this study (CAI = 0.0/h). - Mild oxygen desaturation was noted during this study (Min O2 = 84.00%). - The patient snored with Loud snoring volume. - EKG findings include Atrial Flutter. - Mild periodic limb movements of sleep occurred during the study. Associated arousals were significant.  EKG:  EKG is  ordered today.  The ekg ordered today demonstrates atrial flutter at 78 bpm.   Recent Labs: 02/26/2017: BUN 14; Creatinine, Ser 1.05; Hemoglobin 17.3; Platelets 250; Potassium 4.7; Sodium 142   Recent Lipid Panel No results found for: CHOL, TRIG, HDL, CHOLHDL, VLDL, LDLCALC, LDLDIRECT  Physical Exam:    VS:  BP 102/68   Pulse 78   Ht 5' 6.5" (1.689 m)   Wt 190 lb 12.8 oz (86.5 kg)   BMI 30.33 kg/m     Wt Readings from Last 3 Encounters:  12/04/17 190 lb 12.8 oz (86.5 kg)  10/10/17 195 lb 9.6 oz (88.7 kg)  07/01/17 213 lb 12.8 oz (97 kg)     Physical Exam  Constitutional: He is oriented to person, place, and time. He appears well-developed and well-nourished. No distress.  HENT:  Head: Normocephalic and atraumatic.  Neck: Normal range of motion. Neck supple. No JVD present.  Cardiovascular: Normal rate, regular rhythm, normal heart sounds and intact distal pulses. Exam reveals no gallop and no friction rub.  No murmur heard. Pulmonary/Chest: Effort normal and breath sounds normal. No respiratory distress. He has no wheezes. He has no rales.  Abdominal:  Soft. Bowel sounds are normal.  Musculoskeletal: Normal range of motion. He exhibits no edema or deformity.  Neurological: He is alert and oriented to person, place, and time.  Skin: Skin is warm and dry.  Psychiatric: He has a normal mood and affect. His behavior is normal. Judgment and thought content normal.  Vitals reviewed.   ASSESSMENT:    1. Typical atrial flutter (HCC)    PLAN:    In order of problems listed above:  Atrial flutter: Hx of aflutter since 2017.  Was maintaining sinus rhythm on beta-blocker.  Missed several days of beta-blocker and was drinking fairly heavily.  He again developed atrial flutter with rates mostly controlled but increased rates with strenuous exertion, causing lightheadedness and activity intolerance. I do not think we need to proceed with a TEE cardioversion at this time.  I will increase his beta-blocker back to his prior twice daily dosing and arrange for cardioversion in 4 weeks.  I have instructed the patient to ensure that he does not miss any doses of his anticoagulant. .  Patient expresses that he would like to not have to be on medication or anticoagulation for the rest of his life.  We discussed possible ablation.  His mother had an ablation and he is familiar.  He is concerned about the cost.  He agrees to see EP to discuss his options. If he sees EP prior to his cardioversion and they decide on a different option, they can cancel the cardioversion.    Medication Adjustments/Labs and Tests Ordered: Current medicines are reviewed at length with the patient today.  Concerns regarding medicines are outlined above. Labs and tests ordered and medication changes are outlined in the patient instructions below:  Patient Instructions  Medication Instructions:  INCREASE: Toprol to 50 mg twice a day  If you need a refill on your cardiac medications before your next appointment, please call your pharmacy.   Lab work: FUTURE: BMET &  CBC  Testing/Procedures: Dear Earleen Reaper  You are scheduled for a TEE/Cardioversion/TEE Cardioversion on 12/29/17 with Dr. Delton See.  Please arrive at the [facility] (Main Entrance A) at [facility]: [address] [place], [place] [address] at 7:30 am. (1 hour prior to procedure)  DIET: Nothing to eat or drink after midnight except a sip of  water with medications (see medication instructions below)  Medication Instructions: Do not take your viagra the night before your procedure  Continue your anticoagulant:  ELIQUIS  You will need to continue your anticoagulant after your procedure until you are told by your  Provider that it is safe to stop   Labs: If patient is on Coumadin, patient needs pt/INR, CBC, BMET within 3 days (No pt/INR needed for patients taking Xarelto, Eliquis, Pradaxa) For patients receiving anesthesia for TEE and all Cardioversion patients: BMET, CBC within 1 week   Come to the lab at [facility] between the hours of 8:00 am and 4:30 pm. You do not have to be fasting on 12/22/17   You must have a responsible person to drive you home and stay in the waiting area during your procedure. Failure to do so could result in cancellation.  Bring your insurance cards.  *Special Note: Every effort is made to have your procedure done on time. Occasionally there are emergencies that occur at the hospital that may cause delays. Please be patient if a delay does occur.    Follow-Up: You have been referred for a EP consult for Aflutter  Follow up Lizabeth Leyden NP, after cardioversion   Any Other Special Instructions Will Be Listed Below (If Applicable).       Signed, Berton Bon, NP  12/04/2017 4:41 PM    [facility]

## 2017-12-08 ENCOUNTER — Encounter (HOSPITAL_COMMUNITY): Payer: Self-pay | Admitting: Student

## 2017-12-08 ENCOUNTER — Other Ambulatory Visit (HOSPITAL_COMMUNITY): Payer: Self-pay

## 2017-12-08 ENCOUNTER — Other Ambulatory Visit: Payer: Self-pay

## 2017-12-08 ENCOUNTER — Inpatient Hospital Stay (HOSPITAL_COMMUNITY)
Admission: EM | Admit: 2017-12-08 | Discharge: 2017-12-11 | DRG: 274 | Disposition: A | Discharge status: Home / Self Care | Payer: BC Managed Care – PPO | Attending: Internal Medicine | Admitting: Internal Medicine

## 2017-12-08 ENCOUNTER — Telehealth: Payer: Self-pay | Admitting: Interventional Cardiology

## 2017-12-08 ENCOUNTER — Emergency Department (HOSPITAL_COMMUNITY): Payer: BC Managed Care – PPO

## 2017-12-08 DIAGNOSIS — Z87891 Personal history of nicotine dependence: Secondary | ICD-10-CM | POA: Diagnosis not present

## 2017-12-08 DIAGNOSIS — Z9114 Patient's other noncompliance with medication regimen: Secondary | ICD-10-CM

## 2017-12-08 DIAGNOSIS — R079 Chest pain, unspecified: Secondary | ICD-10-CM

## 2017-12-08 DIAGNOSIS — R55 Syncope and collapse: Secondary | ICD-10-CM | POA: Diagnosis not present

## 2017-12-08 DIAGNOSIS — F1010 Alcohol abuse, uncomplicated: Secondary | ICD-10-CM | POA: Diagnosis present

## 2017-12-08 DIAGNOSIS — I4892 Unspecified atrial flutter: Secondary | ICD-10-CM

## 2017-12-08 DIAGNOSIS — I459 Conduction disorder, unspecified: Secondary | ICD-10-CM | POA: Diagnosis not present

## 2017-12-08 DIAGNOSIS — E669 Obesity, unspecified: Secondary | ICD-10-CM | POA: Diagnosis present

## 2017-12-08 DIAGNOSIS — I498 Other specified cardiac arrhythmias: Secondary | ICD-10-CM

## 2017-12-08 DIAGNOSIS — I517 Cardiomegaly: Secondary | ICD-10-CM | POA: Diagnosis not present

## 2017-12-08 DIAGNOSIS — Z7901 Long term (current) use of anticoagulants: Secondary | ICD-10-CM

## 2017-12-08 DIAGNOSIS — Z79899 Other long term (current) drug therapy: Secondary | ICD-10-CM | POA: Diagnosis not present

## 2017-12-08 DIAGNOSIS — Z6838 Body mass index (BMI) 38.0-38.9, adult: Secondary | ICD-10-CM

## 2017-12-08 DIAGNOSIS — Z23 Encounter for immunization: Secondary | ICD-10-CM | POA: Diagnosis not present

## 2017-12-08 DIAGNOSIS — I499 Cardiac arrhythmia, unspecified: Secondary | ICD-10-CM

## 2017-12-08 DIAGNOSIS — I483 Typical atrial flutter: Secondary | ICD-10-CM | POA: Diagnosis not present

## 2017-12-08 LAB — MRSA PCR SCREENING: MRSA BY PCR: NEGATIVE

## 2017-12-08 LAB — BASIC METABOLIC PANEL
Anion gap: 7 (ref 5–15)
BUN: 14 mg/dL (ref 6–20)
CO2: 27 mmol/L (ref 22–32)
Calcium: 9.1 mg/dL (ref 8.9–10.3)
Chloride: 106 mmol/L (ref 98–111)
Creatinine, Ser: 1.15 mg/dL (ref 0.61–1.24)
GFR calc Af Amer: >60 mL/min (ref >60)
GFR calc non Af Amer: >60 mL/min (ref >60)
GLUCOSE: 87 mg/dL (ref 70–99)
POTASSIUM: 3.7 mmol/L (ref 3.5–5.1)
Sodium: 140 mmol/L (ref 135–145)

## 2017-12-08 LAB — CBC
HEMATOCRIT: 50.1 % (ref 39.0–52.0)
Hemoglobin: 16.6 g/dL (ref 13.0–17.0)
MCH: 32.9 pg (ref 26.0–34.0)
MCHC: 33.1 g/dL (ref 30.0–36.0)
MCV: 99.2 fL (ref 80.0–100.0)
Platelets: 222 10*3/uL (ref 150–400)
RBC: 5.05 MIL/uL (ref 4.22–5.81)
RDW: 14.1 % (ref 11.5–15.5)
WBC: 9.1 10*3/uL (ref 4.0–10.5)
nRBC: 0 % (ref 0.0–0.2)

## 2017-12-08 LAB — I-STAT TROPONIN, ED: Troponin i, poc: 0.01 ng/mL (ref 0.00–0.08)

## 2017-12-08 LAB — T4, FREE: Free T4: 1.11 ng/dL (ref 0.82–1.77)

## 2017-12-08 LAB — HEPATIC FUNCTION PANEL
ALK PHOS: 31 U/L — AB (ref 38–126)
ALT: 20 U/L (ref 0–44)
AST: 17 U/L (ref 15–41)
Albumin: 3.7 g/dL (ref 3.5–5.0)
BILIRUBIN INDIRECT: 0.7 mg/dL (ref 0.3–0.9)
Bilirubin, Direct: 0.2 mg/dL (ref 0.0–0.2)
TOTAL PROTEIN: 5.9 g/dL — AB (ref 6.5–8.1)
Total Bilirubin: 0.9 mg/dL (ref 0.3–1.2)

## 2017-12-08 LAB — TROPONIN I: Troponin I: <0.03 ng/mL (ref <0.03)

## 2017-12-08 LAB — TSH: TSH: 2.783 u[IU]/mL (ref 0.350–4.500)

## 2017-12-08 LAB — MAGNESIUM: Magnesium: 1.8 mg/dL (ref 1.7–2.4)

## 2017-12-08 MED ORDER — ONDANSETRON HCL 4 MG/2ML IJ SOLN: 4.0000 mg | Freq: Four times a day (QID) | INTRAMUSCULAR | Status: DC | PRN

## 2017-12-08 MED ORDER — APIXABAN 5 MG PO TABS
5.0000 mg | ORAL_TABLET | Freq: Two times a day (BID) | ORAL | Status: DC
  Administered 2017-12-08 – 2017-12-11 (×5): 5 mg via ORAL
  Filled 2017-12-08 (×6): qty 1

## 2017-12-08 MED ORDER — INFLUENZA VAC SPLIT QUAD 0.5 ML IM SUSY
0.5000 mL | PREFILLED_SYRINGE | INTRAMUSCULAR | Status: AC
  Administered 2017-12-11: 0.5 mL via INTRAMUSCULAR
  Filled 2017-12-08: qty 0.5

## 2017-12-08 MED ORDER — ALBUTEROL SULFATE HFA 108 (90 BASE) MCG/ACT IN AERS: 2.0000 | INHALATION_SPRAY | Freq: Four times a day (QID) | RESPIRATORY_TRACT | Status: DC | PRN

## 2017-12-08 MED ORDER — FLUTICASONE PROPIONATE 50 MCG/ACT NA SUSP: 2.0000 | Freq: Every day | NASAL | Status: DC

## 2017-12-08 MED ORDER — ACETAMINOPHEN 325 MG PO TABS: 650.0000 mg | ORAL_TABLET | ORAL | Status: DC | PRN

## 2017-12-08 NOTE — ED Notes (Addendum)
MD Messick notified about heart rate dipping into the 30's.  Per Messick, heart rate is fine as long as patient doesn't pass out or become hypotensive. Pt mentating well. Will continue to monitor.

## 2017-12-08 NOTE — Telephone Encounter (Signed)
Pt c/o Syncope: STAT if syncope occurred within 30 minutes and pt complains of lightheadedness High Priority if episode of passing out, completely, today or in last 24 hours   1. Did you pass out today?  yes  When is the last time you passed out? About 20 minutes ago Has this occurred multiple times? yes   2. Did you have any symptoms prior to passing out? lightheaded

## 2017-12-08 NOTE — ED Provider Notes (Signed)
[facility] EMERGENCY DEPARTMENT Provider Note   CSN: [id number] Arrival date & time: 12/08/17  1532     History   Chief Complaint Chief Complaint  Patient presents with  . Palpitations    HPI Marvin Simmons is a 57 y.o. male with a hx of paroxysmal atrial flutter s/p cardioversion 02/2017 anticoagulated on Eliquis who presents to the ED for multiple syncopal episodes today.  Patient states that between 1330 and 1530 this afternoon he had one syncopal episode as well as approximately 6-8 near syncope episodes.  Patient relays that he was seated at his desk at work when he passed out out of nowhere.  He states that he believes he was unconscious for only a few brief seconds.  He did not fall to the ground or injure his head.  He states that he remained seated and 5 minutes later he began to feel lightheaded as if he may pass out again therefore he put his head down and the sensation resolved fairly quickly. This near syncope sensation re-occurred several times while seated this afternoon.  He states that he called his cardiology office and was instructed to come to the emergency department for evaluation.  There are no specific alleviating or aggravating factors to his symptoms, no specific change with position or with exertion.  He states that he did not drink as much as he typically does today but this was not significantly different.  He states that throughout the day today he has not noted any significant chest pain or shortness of breath, he will sometimes get a "funny feeling" and points to his chest.  Patient states that he did have an exertional syncope event last week which prompted a cardiology visit 10/10.  He states that his heart rate was around 100s throughout the week last night as well.  Per cardiology visit review, plan for increase in metoprolol succinate dose from 50 mg daily to 50 mg twice daily which patient has been compliant with, with plans for  cardioversion 12/29/17 by Dr. Delton See.  He did have some noncompliance with his Eliquis last week, has been compliant over the weekend. Denies leg pain/swelling, hemoptysis, recent surgery/trauma, recent long travel, personal hx of cancer, or hx of DVT/PE.  He does receive biweekly testosterone injections.    HPI  Past Medical History:  Diagnosis Date  . Atrial flutter (HCC) 2017  . Morbid obesity (HCC) 12/31/2016    Patient Active Problem List   Diagnosis Date Noted  . Atrial flutter (HCC) 01/14/2016  . Anticoagulation adequate 01/14/2016    Past Surgical History:  Procedure Laterality Date  . CARDIOVERSION N/A 03/07/2017   Procedure: CARDIOVERSION;  Surgeon: Thurmon Fair, MD;  Location: [facility];  Service: Cardiovascular;  Laterality: N/A;  . SHOULDER SURGERY Right   . TEE WITHOUT CARDIOVERSION N/A 03/07/2017   Procedure: TRANSESOPHAGEAL ECHOCARDIOGRAM (TEE);  Surgeon: Thurmon Fair, MD;  Location[facility] ENDOSCOPY;  Service: Cardiovascular;  Laterality: N/A;  . TONSILLECTOMY        Home Medications    Prior to Admission medications   Medication Sig Start Date End Date Taking? Authorizing Provider  albuterol (PROAIR HFA) 108 (90 Base) MCG/ACT inhaler Inhale 2 puffs into the lungs every 6 (six) hours as needed for wheezing or shortness of breath.     [provider]  apixaban (ELIQUIS) 5 MG TABS tablet Take 1 tablet (5 mg total) by mouth 2 (two) times daily. 02/05/17   Lyn Records, MD  ibuprofen (ADVIL,MOTRIN)  200 MG tablet Take 400 mg by mouth every 6 (six) hours as needed for headache or moderate pain.    [provider]  metoprolol succinate (TOPROL-XL) 50 MG 24 hr tablet Take 1 tablet (50 mg total) by mouth 2 (two) times daily. 12/04/17   Berton Bon, NP  mometasone (NASONEX) 50 MCG/ACT nasal spray Place 2 sprays into the nose 2 (two) times daily as needed (for allergies).     [provider]  sildenafil (VIAGRA) 50 MG tablet Take 50 mg by  mouth daily as needed for erectile dysfunction.    [provider]  testosterone cypionate (DEPOTESTOSTERONE CYPIONATE) 200 MG/ML injection Inject 200 mg into the muscle every 14 (fourteen) days. 09/16/15   [provider]    Family History Family History  Problem Relation Age of Onset  . Emphysema Mother   . Lactose intolerance Father     Social History Social History   Tobacco Use  . Smoking status: Former Smoker    Last attempt to quit: 09/18/1990    Years since quitting: 27.2  . Smokeless tobacco: Former Neurosurgeon    Types: Snuff    Quit date: 09/18/1990  Substance Use Topics  . Alcohol use: Yes    Comment: avg 4oz alcohol/day  . Drug use: No     Allergies   Patient has no known allergies.   Review of Systems Review of Systems  Respiratory: Negative for shortness of breath.   Cardiovascular: Negative for chest pain and palpitations.  Gastrointestinal: Negative for abdominal pain and vomiting.  Neurological: Positive for syncope and light-headedness. Negative for weakness and numbness.  All other systems reviewed and are negative.  Physical Exam Updated Vital Signs BP 127/81   Pulse 92   Temp 98 F (36.7 C)   Resp 18   Wt 85.7 kg   SpO2 98%   BMI 30.05 kg/m   Physical Exam  Constitutional: He appears well-developed and well-nourished.  Non-toxic appearance. No distress.  HENT:  Head: Normocephalic and atraumatic.  Eyes: Conjunctivae are normal. Right eye exhibits no discharge. Left eye exhibits no discharge.  Neck: Neck supple.  Cardiovascular: Normal rate and intact distal pulses. An irregularly irregular rhythm present.  Pulses:      Radial pulses are 2+ on the right side, and 2+ on the left side.       Posterior tibial pulses are 2+ on the right side, and 2+ on the left side.  Pulmonary/Chest: Effort normal and breath sounds normal. No respiratory distress. He has no wheezes. He has no rhonchi. He has no rales.  Respiration even and  unlabored  Abdominal: Soft. He exhibits no distension. There is no tenderness.  Musculoskeletal: He exhibits no edema or tenderness.  Neurological: He is alert.  Clear speech.  CN III through XII grossly intact.  Sensation grossly intact bilateral upper and lower extremities.  5 out of 5 symmetric grip strength.  5 out of 5 strength plantar dorsiflexion bilaterally.  Normal finger-nose.  Negative pronator drift.  Negative Romberg.  Ambulatory.  Skin: Skin is warm and dry. No rash noted.  Psychiatric: He has a normal mood and affect. His behavior is normal.  Nursing note and vitals reviewed.   ED Treatments / Results  Labs Results for orders placed or performed during the hospital encounter of 12/08/17  Basic metabolic panel  Result Value Ref Range   Sodium 140 135 - 145 mmol/L   Potassium 3.7 3.5 - 5.1 mmol/L   Chloride 106 98 -  111 mmol/L   CO2 27 22 - 32 mmol/L   Glucose, Bld 87 70 - 99 mg/dL   BUN 14 6 - 20 mg/dL   Creatinine, Ser 4.09 0.61 - 1.24 mg/dL   Calcium 9.1 8.9 - 81.1 mg/dL   GFR calc non Af Amer >60 >60 mL/min   GFR calc Af Amer >60 >60 mL/min   Anion gap 7 5 - 15  CBC  Result Value Ref Range   WBC 9.1 4.0 - 10.5 K/uL   RBC 5.05 4.22 - 5.81 MIL/uL   Hemoglobin 16.6 13.0 - 17.0 g/dL   HCT 91.4 78.2 - 95.6 %   MCV 99.2 80.0 - 100.0 fL   MCH 32.9 26.0 - 34.0 pg   MCHC 33.1 30.0 - 36.0 g/dL   RDW 21.3 08.6 - 57.8 %   Platelets 222 150 - 400 K/uL   nRBC 0.0 0.0 - 0.2 %  I-stat troponin, ED  Result Value Ref Range   Troponin i, poc 0.01 0.00 - 0.08 ng/mL   Comment 3            EKG EKG Interpretation  Date/Time:  Monday December 08 2017 16:27:21 EDT Ventricular Rate:  20 PR Interval:    QRS Duration: 140 QT Interval:  513 QTC Calculation: 296 R Axis:   7 Text Interpretation:  Prolonged PR interval Right bundle branch block Lateral infarct, acute Baseline wander in lead(s) V3 Brady with underlying atrial flutter  Confirmed by Kristine Royal [phone]) on  12/08/2017 4:34:55 PM   Radiology Dg Chest Port 1 View  Result Date: 12/08/2017 CLINICAL DATA:  Initial evaluation for acute chest pain. EXAM: PORTABLE CHEST 1 VIEW COMPARISON:  Prior radiograph 09/18/2015. FINDINGS: Extent to a shin of the cardiac silhouette related AP technique. Defibrillator pads overlie the left and lower chest. Mediastinal silhouette within normal limits. Lungs normally inflated. No focal infiltrates. No pulmonary edema or pleural effusion. No pneumothorax. No acute osseus abnormality. IMPRESSION: No active cardiopulmonary disease. Electronically Signed   By: Rise Mu M.D.   On: 12/08/2017 16:59    Procedures Procedures (including critical care time)  Medications Ordered in ED Medications - No data to display   Initial Impression / Assessment and Plan / ED Course  I have reviewed the triage vital signs and the nursing notes.  Pertinent labs & imaging results that were available during my care of the patient were reviewed by me and considered in my medical decision making (see chart for details).   Patient with history of atrial flutter anticoagulated on Eliquis with recent dose change of metoprolol who presents to the ER with multiple syncope/near syncope episodes today.  Upon arrival to the ER patient is nontoxic-appearing, resting comfortably.  On the monitor he appears to be in atrial flutter with normal rate.  Plan for cardiology consultation with labs, chest x-ray, and EKG.  16:30: RE-EVAL: EDT informed supervising physician  Dr. Rodena Medin that patient had concerning rhythm on the monitor, 12-lead EKG reviewed from this time frame and it appears the patient was in atrial flutter with significant pauses, pictured below, suspicious as etiology to patient's sxs prompting ER presentation. Upon our arrival to the room patient in aflutter with a rate in the 70s resting comfortably, BP WNL.  He does not report any significant symptoms during which this arrhythmia  occurred.    Labs reviewed: no leukocytosis, anemia, or significant electrolyte disturbance. CXR without edema, consolidations, or pneumothorax. Initial Troponin WNL. Suspect patient's sxs are related to  pauses seen on EKG/monitor, low suspicion for ACS, pulmonary embolism, or dissection.   17:40: CONSULT: Discussed with cardiologist Dr. Jacques Navy- accepts admission, plan for beta blocker washout to determine if pacemaker is necessary.   Findings and plan of care discussed with supervising physician Dr. Rodena Medin who personally evaluated and examined this patient and is in agreement.   Final Clinical Impressions(s) / ED Diagnoses   Final diagnoses:  Syncope, unspecified syncope type  Atrial flutter, unspecified type [facility])    ED Discharge Orders    None       Cherly Anderson, PA-C 12/08/17 1844    Wynetta Fines, MD 12/16/17 0700

## 2017-12-08 NOTE — H&P (Signed)
CARDIOLOGY ADMISSION NOTE  Patient ID: Marvin Simmons MRN: [id number] DOB/AGE: November 25, 1960 57 y.o.  Admit date: 12/08/2017 Primary Physician    Primary Cardiologist   Lesleigh Noe, MD Chief Complaint    Syncope and presyncope  HPI: Marvin Simmons is a 57 y.o. male with a past medical history significant for paroxysmal atrial flutter s/p cardioversion in 02/2017, on apixaban, and obesity.  CURRENT EPISODE: On 12/01/17 sitting at desk at work he noted an odd feeling in his chest, possibly fluttering, tightness in the upper chest. He had been hiking all day on Sunday. Upon review of his Apple watch HR was around 100 all day on Monday and Tuesday. On 12/04/17 upon awakening HR was around 50 then went up 100 with normal activity. His normal HR is about 70's. He has had lighhtheadedness on 12/03/17 when he went running outside and had syncope and fell to the ground. 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  He admits that he had forgotten to take his medicines all weekend 10/5-10/6, and had been drinking alcohol heavily over the weekend. No metoprolol or Eliquis. He has been taking both since 10/7 and continues to be in a-flutter.   On his follow up appointment on 10/10, his metoprolol dose was increased to BID dosing, metoprolol succinate 50 mg BID.  He denies chest pain, current shortness of breath, palpitations, PND, orthopnea, leg swelling, fever, chills, N/V/D. Endorses lightheadedness and "head rush" sensation with bradycardic episodes.  PREVIOUS HISTORY: Marvin Simmons was first diagnosed with atrial flutter in 2017 after he noticed intermittent heart rate of greater than 130 bpm.  Sleep study was negative for sleep apnea.  Initially not anticoagulated due to CHA2DS2-VASc score of 0.  He was maintaining sinus rhythm on beta-blocker until he had a  recurrence of a flutter in 12/2016 in the setting of poor medication compliance. After finding poor control on 48-hour monitor the patient was started on amiodarone and Eliquis and continued on metoprolol in 01/2017.  At follow-up in 02/2017 the patient continued in a flutter with a rate of 89.  He subsequently underwent successful DC cardioversion to sinus rhythm. He maintained sinus rhythm and amiodarone was discontinued in 06/2017.   Past Medical History:  Diagnosis Date  . Atrial flutter (HCC) 2017  .  12/31/2016    Past Surgical History:  Procedure Laterality Date  . CARDIOVERSION N/A 03/07/2017   Procedure: CARDIOVERSION;  Surgeon: Thurmon Fair, MD;  Location: [facility] ENDOSCOPY;  Service: Cardiovascular;  Laterality: N/A;  . SHOULDER SURGERY Right   . TEE WITHOUT CARDIOVERSION N/A 03/07/2017   Procedure: TRANSESOPHAGEAL ECHOCARDIOGRAM (TEE);  Surgeon: Thurmon Fair, MD;  Location: [facility] ENDOSCOPY;  Service: Cardiovascular;  Laterality: N/A;  . TONSILLECTOMY      No Known Allergies No current facility-administered medications on file prior to encounter.    Current Outpatient Medications on File Prior to Encounter  Medication Sig Dispense Refill  . albuterol (PROAIR HFA) 108 (90 Base) MCG/ACT inhaler Inhale 2 puffs into the lungs every 6 (six) hours as needed for wheezing or shortness of breath.     Marland Kitchen apixaban (ELIQUIS) 5 MG TABS tablet Take 1 tablet (5 mg total) by mouth 2 (two) times daily. 180 tablet 3  . ibuprofen (ADVIL,MOTRIN) 200 MG tablet Take 400 mg by mouth every 6 (six) hours as needed for headache or moderate  pain.    . metoprolol succinate (TOPROL-XL) 50 MG 24 hr tablet Take 1 tablet (50 mg total) by mouth 2 (two) times daily. 180 tablet 3  . mometasone (NASONEX) 50 MCG/ACT nasal spray Place 2 sprays into the nose 2 (two) times daily as needed (seasonal allergies).     . sildenafil (VIAGRA) 50 MG tablet Take 50 mg by mouth daily as needed for erectile dysfunction.    Marland Kitchen  testosterone cypionate (DEPOTESTOSTERONE CYPIONATE) 200 MG/ML injection Inject 200 mg into the muscle every 14 (fourteen) days.     Social History   Socioeconomic History  . Marital status: Married    Spouse name: Not on file  . Number of children: Not on file  . Years of education: Not on file  . Highest education level: Not on file  Occupational History  . Not on file  Social Needs  . Financial resource strain: Not on file  . Food insecurity:    Worry: Not on file    Inability: Not on file  . Transportation needs:    Medical: Not on file    Non-medical: Not on file  Tobacco Use  . Smoking status: Former Smoker    Last attempt to quit: 09/18/1990    Years since quitting: 27.2  . Smokeless tobacco: Former Neurosurgeon    Types: Snuff    Quit date: 09/18/1990  Substance and Sexual Activity  . Alcohol use: Yes    Comment: avg 4oz alcohol/day  . Drug use: No  . Sexual activity: Not on file  Lifestyle  . Physical activity:    Days per week: Not on file    Minutes per session: Not on file  . Stress: Not on file  Relationships  . Social connections:    Talks on phone: Not on file    Gets together: Not on file    Attends religious service: Not on file    Active member of club or organization: Not on file    Attends meetings of clubs or organizations: Not on file    Relationship status: Not on file  . Intimate partner violence:    Fear of current or ex partner: Not on file    Emotionally abused: Not on file    Physically abused: Not on file    Forced sexual activity: Not on file  Other Topics Concern  . Not on file  Social History Narrative  . Not on file    Family History  Problem Relation Age of Onset  . Emphysema Mother   . Lactose intolerance Father      ROS:  Per hpi  Physical Exam: Blood pressure (!) 123/91, pulse (!) 101, temperature 98.3 F (36.8 C), temperature source Oral, resp. rate 15, weight 88.5 kg, SpO2 97 %.  Constitutional: No acute distress Eyes:  pupils equally round and reactive to light, sclera non-icteric, normal conjunctiva and lids ENMT: normal dentition, moist mucous membranes Cardiovascular: irregular rhythm, rates 29-70, no murmurs. S1 and S2 normal. Radial pulses normal bilaterally. No jugular venous distention.  Respiratory: clear to auscultation bilaterally GI : normal bowel sounds, soft and nontender. No distention.   MSK: extremities warm, well perfused. No edema.  NEURO: grossly nonfocal exam, moves all extremities. PSYCH: alert and oriented x 3, normal mood and affect.   Labs: Lab Results  Component Value Date   BUN 14 12/08/2017   Lab Results  Component Value Date   CREATININE 1.15 12/08/2017   Lab Results  Component Value Date  NA 140 12/08/2017   K 3.7 12/08/2017   CL 106 12/08/2017   CO2 27 12/08/2017   No results found for: TROPONINI Lab Results  Component Value Date   WBC 9.1 12/08/2017   HGB 16.6 12/08/2017   HCT 50.1 12/08/2017   MCV 99.2 12/08/2017   PLT 222 12/08/2017   No results found for: CHOL, HDL, LDLCALC, LDLDIRECT, TRIG, CHOLHDL No results found for: ALT, AST, GGT, ALKPHOS, BILITOT   Radiology:  CXR reviewed  EKG - personally reviewed Atrial flutter with slow ventricular response 4.5 second pauses  Telemetry Atrial flutter with slow ventricular response 4.5 second pauses  Assessment and Plan:  Active Problems:   Atrial flutter (HCC)   Syncope   Slow ventricular response  Marvin Simmons is demonstrating symptomatic pauses in atrial flutter.  Pauses are at a maximum 4.5 seconds as seen on telemetry and ECG.  He is been on rate control with metoprolol succinate 50 mg, recently uptitrated to twice daily.  He tells me that when he stopped his medications 2 weekends ago he did have rapid ventricular response and atrial flutter.  Given that he is symptomatic with pauses, I would like to observe him in the hospital for washout of his metoprolol to evaluate whether he continues to  demonstrate conduction system disease without AV nodal blockade on board.  After adequate beta-blockade washout (5 doses) we will consult our colleagues in electrophysiology as needed to assist with decisions about ongoing medical therapy for rate control.  If he demonstrates continued conduction system disease, it would be reasonable to consider further electrophysiology guidance regarding a flutter ablation or pacemaker implant.  I suspect his current issues are mostly related to beta-blocker therapy, but this remains to be seen in the next day or so.  We will continue his Eliquis at this time.  No other active issues.  I have explained the rationale for hospitalization and our plan to the patient and his wife and they demonstrated understanding.   Signed,  Parke Poisson 9:51 PM 12/08/17

## 2017-12-08 NOTE — Telephone Encounter (Signed)
Patient complaining of passing out several times this morning, last one was about 20 minutes ago. Patient stated he was feeling lightheaded and just passed out at this desk and then 10 minutes later he passed out again. Patient stated both times he passed out he had spasms as well. Patient stated his HR is 65 right now, but has been 40's and 50's. Patient took Metoprolol succinate 50 mg this morning. Patient stated he did eat lightly this morning. Since patient is passing out and is symptomatic, informed patient to go to the ED. Encouraged patient not to drive himself and if he feels worse to call 911. Patient stated he feels better than what he did the first time he passed out, and he will have his wife drive him to the ED.

## 2017-12-08 NOTE — ED Notes (Signed)
150 bolus of Amio started.

## 2017-12-08 NOTE — ED Notes (Addendum)
Atrial flutter present, pt reports feeling "weird" with some SOB, mentating well, MD Messick notified and at bed side.

## 2017-12-08 NOTE — Plan of Care (Signed)
  Problem: Activity: Goal: Risk for activity intolerance will decrease Outcome: Progressing   Problem: Education: Goal: Knowledge of General Education information will improve Description Including pain rating scale, medication(s)/side effects and non-pharmacologic comfort measures Outcome: Completed/Met   Problem: Clinical Measurements: Goal: Respiratory complications will improve Outcome: Completed/Met   Problem: Nutrition: Goal: Adequate nutrition will be maintained Outcome: Completed/Met

## 2017-12-08 NOTE — ED Triage Notes (Signed)
Pt in after multiple syncopal episodes, recently had his metoprolol dose doubled, has has multiple episodes like this in the last few weeks, being treated for aflutter, hx of cardioversion

## 2017-12-09 LAB — LIPID PANEL
CHOLESTEROL: 161 mg/dL (ref 0–200)
HDL: 37 mg/dL — AB (ref >40)
LDL Cholesterol: 110 mg/dL — ABNORMAL HIGH (ref 0–99)
TRIGLYCERIDES: 70 mg/dL (ref <150)
Total CHOL/HDL Ratio: 4.4 RATIO
VLDL: 14 mg/dL (ref 0–40)

## 2017-12-09 LAB — CBC
HCT: 45.9 % (ref 39.0–52.0)
Hemoglobin: 15.4 g/dL (ref 13.0–17.0)
MCH: 32.4 pg (ref 26.0–34.0)
MCHC: 33.6 g/dL (ref 30.0–36.0)
MCV: 96.6 fL (ref 80.0–100.0)
PLATELETS: 208 10*3/uL (ref 150–400)
RBC: 4.75 MIL/uL (ref 4.22–5.81)
RDW: 13.9 % (ref 11.5–15.5)
WBC: 8.1 10*3/uL (ref 4.0–10.5)
nRBC: 0 % (ref 0.0–0.2)

## 2017-12-09 LAB — BASIC METABOLIC PANEL
ANION GAP: 8 (ref 5–15)
BUN: 11 mg/dL (ref 6–20)
CHLORIDE: 107 mmol/L (ref 98–111)
CO2: 25 mmol/L (ref 22–32)
CREATININE: 0.92 mg/dL (ref 0.61–1.24)
Calcium: 8.5 mg/dL — ABNORMAL LOW (ref 8.9–10.3)
GFR calc non Af Amer: >60 mL/min (ref >60)
Glucose, Bld: 76 mg/dL (ref 70–99)
POTASSIUM: 3.5 mmol/L (ref 3.5–5.1)
SODIUM: 140 mmol/L (ref 135–145)

## 2017-12-09 LAB — TROPONIN I: Troponin I: <0.03 ng/mL (ref <0.03)

## 2017-12-09 LAB — HIV ANTIBODY (ROUTINE TESTING W REFLEX): HIV Screen 4th Generation wRfx: NONREACTIVE

## 2017-12-09 MED ORDER — MAGNESIUM SULFATE 2 GM/50ML IV SOLN
2.0000 g | Freq: Once | INTRAVENOUS | Status: AC
  Administered 2017-12-09: 2 g via INTRAVENOUS
  Filled 2017-12-09: qty 50

## 2017-12-09 MED ORDER — POTASSIUM CHLORIDE CRYS ER 20 MEQ PO TBCR
30.0000 meq | EXTENDED_RELEASE_TABLET | Freq: Two times a day (BID) | ORAL | Status: AC
  Administered 2017-12-09 (×2): 30 meq via ORAL
  Filled 2017-12-09 (×3): qty 1

## 2017-12-09 NOTE — Progress Notes (Signed)
Pt experiencing multiple frequent 2-6 second pauses noted on telemetry. Pt remains asymptomatic during most pauses. Verbalizes feelings of fluttering and lightheadedness when HR in 20s-30s.

## 2017-12-09 NOTE — Discharge Instructions (Addendum)
No driving for 4 days. No lifting over 5 lbs for 1 week. No sexual activity for 1 week. You may return to work in 1 week. Keep procedure site clean & dry. If you notice increased pain, swelling, bleeding or pus, call/return!  You may shower, but no soaking baths/hot tubs/pools for 1 week.    Heart-Healthy Eating Plan Heart-healthy meal planning includes:  Limiting unhealthy fats.  Increasing healthy fats.  Making other small dietary changes.  You may need to talk with your doctor or a diet specialist (dietitian) to create an eating plan that is right for you. What types of fat should I choose?  Choose healthy fats. These include olive oil and canola oil, flaxseeds, walnuts, almonds, and seeds.  Eat more omega-3 fats. These include salmon, mackerel, sardines, tuna, flaxseed oil, and ground flaxseeds. Try to eat fish at least twice each week.  Limit saturated fats. ? Saturated fats are often found in animal products, such as meats, butter, and cream. ? Plant sources of saturated fats include palm oil, palm kernel oil, and coconut oil.  Avoid foods with partially hydrogenated oils in them. These include stick margarine, some tub margarines, cookies, crackers, and other baked goods. These contain trans fats. What general guidelines do I need to follow?  Check food labels carefully. Identify foods with trans fats or high amounts of saturated fat.  Fill one half of your plate with vegetables and green salads. Eat 4-5 servings of vegetables per day. A serving of vegetables is: ? 1 cup of raw leafy vegetables. ?  cup of raw or cooked cut-up vegetables. ?  cup of vegetable juice.  Fill one fourth of your plate with whole grains. Look for the word "whole" as the first word in the ingredient list.  Fill one fourth of your plate with lean protein foods.  Eat 4-5 servings of fruit per day. A serving of fruit is: ? One medium whole fruit. ?  cup of dried fruit. ?  cup of fresh, frozen,  or canned fruit. ?  cup of 100% fruit juice.  Eat more foods that contain soluble fiber. These include apples, broccoli, carrots, beans, peas, and barley. Try to get 20-30 g of fiber per day.  Eat more home-cooked food. Eat less restaurant, buffet, and fast food.  Limit or avoid alcohol.  Limit foods high in starch and sugar.  Avoid fried foods.  Avoid frying your food. Try baking, boiling, grilling, or broiling it instead. You can also reduce fat by: ? Removing the skin from poultry. ? Removing all visible fats from meats. ? Skimming the fat off of stews, soups, and gravies before serving them. ? Steaming vegetables in water or broth.  Lose weight if you are overweight.  Eat 4-5 servings of nuts, legumes, and seeds per week: ? One serving of dried beans or legumes equals  cup after being cooked. ? One serving of nuts equals 1 ounces. ? One serving of seeds equals  ounce or one tablespoon.  You may need to keep track of how much salt or sodium you eat. This is especially true if you have high blood pressure. Talk with your doctor or dietitian to get more information. What foods can I eat? Grains Breads, including [place], white, pita, wheat, raisin, rye, oatmeal, and [place]. Tortillas that are neither fried nor made with lard or trans fat. Low-fat rolls, including hotdog and hamburger buns and English muffins. Biscuits. Muffins. Waffles. Pancakes. Light popcorn. Whole-grain cereals. Flatbread. Melba toast. Pretzels.  Breadsticks. Rusks. Low-fat snacks. Low-fat crackers, including oyster, saltine, matzo, graham, animal, and rye. Rice and pasta, including brown rice and pastas that are made with whole wheat. Vegetables All vegetables. Fruits All fruits, but limit coconut. Meats and Other Protein Sources Lean, well-trimmed beef, veal, pork, and lamb. Chicken and [place] without skin. All fish and shellfish. Wild duck, rabbit, pheasant, and venison. Egg whites or low-cholesterol egg  substitutes. Dried beans, peas, lentils, and tofu. Seeds and most nuts. Dairy Low-fat or nonfat cheeses, including ricotta, string, and mozzarella. Skim or 1% milk that is liquid, powdered, or evaporated. Buttermilk that is made with low-fat milk. Nonfat or low-fat yogurt. Beverages Mineral water. Diet carbonated beverages. Sweets and Desserts Sherbets and fruit ices. Honey, jam, marmalade, jelly, and syrups. Meringues and gelatins. Pure sugar candy, such as hard candy, jelly beans, gumdrops, mints, marshmallows, and small amounts of dark chocolate. [employer]. Eat all sweets and desserts in moderation. Fats and Oils Nonhydrogenated (trans-free) margarines. Vegetable oils, including soybean, sesame, sunflower, olive, peanut, safflower, corn, canola, and cottonseed. Salad dressings or mayonnaise made with a vegetable oil. Limit added fats and oils that you use for cooking, baking, salads, and as spreads. Other Cocoa powder. Coffee and tea. All seasonings and condiments. The items listed above may not be a complete list of recommended foods or beverages. Contact your dietitian for more options. What foods are not recommended? Grains Breads that are made with saturated or trans fats, oils, or whole milk. Croissants. Butter rolls. Cheese breads. Sweet rolls. Donuts. Buttered popcorn. Chow mein noodles. High-fat crackers, such as cheese or butter crackers. Meats and Other Protein Sources Fatty meats, such as hotdogs, short ribs, sausage, spareribs, bacon, rib eye roast or steak, and mutton. High-fat deli meats, such as salami and bologna. Caviar. Domestic duck and goose. Organ meats, such as kidney, liver, sweetbreads, and heart. Dairy Cream, sour cream, cream cheese, and creamed cottage cheese. Whole-milk cheeses, including blue (bleu), [address], [place], [place], [address], [place], [address], cheddar, [place], and [place]. Whole or 2% milk that is liquid, evaporated, or condensed. Whole  buttermilk. Cream sauce or high-fat cheese sauce. Yogurt that is made from whole milk. Beverages Regular sodas and juice drinks with added sugar. Sweets and Desserts Frosting. Pudding. Cookies. Cakes other than angel food cake. Candy that has milk chocolate or white chocolate, hydrogenated fat, butter, coconut, or unknown ingredients. Buttered syrups. Full-fat ice cream or ice cream drinks. Fats and Oils Gravy that has suet, meat fat, or shortening. Cocoa butter, hydrogenated oils, palm oil, coconut oil, palm kernel oil. These can often be found in baked products, candy, fried foods, nondairy creamers, and whipped toppings. Solid fats and shortenings, including bacon fat, salt pork, lard, and butter. Nondairy cream substitutes, such as coffee creamers and sour cream substitutes. Salad dressings that are made of unknown oils, cheese, or sour cream. The items listed above may not be a complete list of foods and beverages to avoid. Contact your dietitian for more information. This information is not intended to replace advice given to you by your health care provider. Make sure you discuss any questions you have with your health care provider. Document Released: 08/13/2011 Document Revised: 07/20/2015 Document Reviewed: 08/05/2013 Elsevier Interactive Patient Education  2018 [employer].    Information on my medicine - ELIQUIS (apixaban)  This medication education was reviewed with me or my healthcare representative as part of my discharge preparation.  The pharmacist that spoke with me during my hospital stay was:  Amend,  Hilario Quarry, [facility]  Why was Eliquis prescribed for you? Eliquis was prescribed for you to reduce the risk of a blood clot forming that can cause a stroke if you have a medical condition called atrial fibrillation (a type of irregular heartbeat).  What do You need to know about Eliquis ? Take your Eliquis TWICE DAILY - one tablet in the morning and one tablet in the evening  with or without food. If you have difficulty swallowing the tablet whole please discuss with your pharmacist how to take the medication safely.  Take Eliquis exactly as prescribed by your doctor and DO NOT stop taking Eliquis without talking to the doctor who prescribed the medication.  Stopping may increase your risk of developing a stroke.  Refill your prescription before you run out.  After discharge, you should have regular check-up appointments with your healthcare provider that is prescribing your Eliquis.  In the future your dose may need to be changed if your kidney function or weight changes by a significant amount or as you get older.  What do you do if you miss a dose? If you miss a dose, take it as soon as you remember on the same day and resume taking twice daily.  Do not take more than one dose of ELIQUIS at the same time to make up a missed dose.  Important Safety Information A possible side effect of Eliquis is bleeding. You should call your healthcare provider right away if you experience any of the following: ? Bleeding from an injury or your nose that does not stop. ? Unusual colored urine (red or dark brown) or unusual colored stools (red or black). ? Unusual bruising for unknown reasons. ? A serious fall or if you hit your head (even if there is no bleeding).  Some medicines may interact with Eliquis and might increase your risk of bleeding or clotting while on Eliquis. To help avoid this, consult your healthcare provider or pharmacist prior to using any new prescription or non-prescription medications, including herbals, vitamins, non-steroidal anti-inflammatory drugs (NSAIDs) and supplements.  This website has more information on Eliquis (apixaban): http://www.eliquis.com/eliquis/home   Cardiac Ablation, Care After This sheet gives you information about how to care for yourself after your procedure. Your health care provider may also give you more specific  instructions. If you have problems or questions, contact your health care provider. What can I expect after the procedure? After the procedure, it is common to have:  Bruising around your puncture site.  Tenderness around your puncture site.  Skipped heartbeats.  Tiredness (fatigue).  Follow these instructions at home: Puncture site care  Follow instructions from your health care provider about how to take care of your puncture site. Make sure you: ? Wash your hands with soap and water before you change your bandage (dressing). If soap and water are not available, use hand sanitizer. ? Change your dressing as told by your health care provider. ? Leave stitches (sutures), skin glue, or adhesive strips in place. These skin closures may need to stay in place for up to 2 weeks. If adhesive strip edges start to loosen and curl up, you may trim the loose edges. Do not remove adhesive strips completely unless your health care provider tells you to do that.  Check your puncture site every day for signs of infection. Check for: ? Redness, swelling, or pain. ? Fluid or blood. If your puncture site starts to bleed, lie down on your back, apply firm pressure to  the area, and contact your health care provider. ? Warmth. ? Pus or a bad smell. Driving  Ask your health care provider when it is safe for you to drive again after the procedure.  Do not drive or use heavy machinery while taking prescription pain medicine.  Do not drive for 24 hours if you were given a medicine to help you relax (sedative) during your procedure. Activity  Avoid activities that take a lot of effort for at least 3 days after your procedure.  Do not lift anything that is heavier than 10 lb (4.5 kg), or the limit that you are told, until your health care provider says that it is safe.  Return to your normal activities as told by your health care provider. Ask your health care provider what activities are safe for  you. General instructions  Take over-the-counter and prescription medicines only as told by your health care provider.  Do not use any products that contain nicotine or tobacco, such as cigarettes and e-cigarettes. If you need help quitting, ask your health care provider.  Do not take baths, swim, or use a hot tub until your health care provider approves.  Do not drink alcohol for 24 hours after your procedure.  Keep all follow-up visits as told by your health care provider. This is important. Contact a health care provider if:  You have redness, mild swelling, or pain around your puncture site.  You have fluid or blood coming from your puncture site that stops after applying firm pressure to the area.  Your puncture site feels warm to the touch.  You have pus or a bad smell coming from your puncture site.  You have a fever.  You have chest pain or discomfort that spreads to your neck, jaw, or arm.  You are sweating a lot.  You feel nauseous.  You have a fast or irregular heartbeat.  You have shortness of breath.  You are dizzy or light-headed and feel the need to lie down.  You have pain or numbness in the arm or leg closest to your puncture site. Get help right away if:  Your puncture site suddenly swells.  Your puncture site is bleeding and the bleeding does not stop after applying firm pressure to the area. These symptoms may represent a serious problem that is an emergency. Do not wait to see if the symptoms will go away. Get medical help right away. Call your local emergency services (911 in the [place] Do not drive yourself to the hospital. Summary  After the procedure, it is normal to have bruising and tenderness at the puncture site in your groin, neck, or forearm.  Check your puncture site every day for signs of infection.  Get help right away if your puncture site is bleeding and the bleeding does not stop after applying firm pressure to the area. This is a  medical emergency. This information is not intended to replace advice given to you by your health care provider. Make sure you discuss any questions you have with your health care provider. Document Released: 05/23/2016 Document Revised: 05/23/2016 Document Reviewed: 05/23/2016 Elsevier Interactive Patient Education  2018 [employer].    Electrical Cardioversion, Care After This sheet gives you information about how to care for yourself after your procedure. Your health care provider may also give you more specific instructions. If you have problems or questions, contact your health care provider. What can I expect after the procedure? After the procedure, it is common to  have:  Some redness on the skin where the shocks were given.  Follow these instructions at home:  Do not drive for 24 hours if you were given a medicine to help you relax (sedative).  Take over-the-counter and prescription medicines only as told by your health care provider.  Ask your health care provider how to check your pulse. Check it often.  Rest for 48 hours after the procedure or as told by your health care provider.  Avoid or limit your caffeine use as told by your health care provider. Contact a health care provider if:  You feel like your heart is beating too quickly or your pulse is not regular.  You have a serious muscle cramp that does not go away. Get help right away if:  You have discomfort in your chest.  You are dizzy or you feel faint.  You have trouble breathing or you are short of breath.  Your speech is slurred.  You have trouble moving an arm or leg on one side of your body.  Your fingers or toes turn cold or blue. This information is not intended to replace advice given to you by your health care provider. Make sure you discuss any questions you have with your health care provider. Document Released: 12/02/2012 Document Revised: 09/15/2015 Document Reviewed: 08/18/2015 Elsevier  Interactive Patient Education  [employer].

## 2017-12-09 NOTE — Progress Notes (Signed)
Progress Note  Patient Name: Marvin Simmons Date of Encounter: 12/09/2017  Primary Cardiologist: Lesleigh Noe, MD   Subjective   Denies symptoms of lightheadedness, presyncope, syncope overnight.  Inpatient Medications    Scheduled Meds: . apixaban  5 mg Oral BID  . Influenza vac split quadrivalent PF  0.5 mL Intramuscular Tomorrow-1000   Continuous Infusions:   PRN Meds: acetaminophen, ondansetron (ZOFRAN) IV   Vital Signs    Vitals:   12/08/17 2027 12/09/17 0031 12/09/17 0514 12/09/17 0814  BP: (!) 123/91 112/69 103/69 115/85  Pulse: (!) 101 (!) 50 71 75  Resp: 15  17   Temp: 98.3 F (36.8 C) 98.1 F (36.7 C) 97.6 F (36.4 C) 97.7 F (36.5 C)  TempSrc: Oral Oral Oral Oral  SpO2: 97%  95% 95%  Weight:   88.5 kg     Intake/Output Summary (Last 24 hours) at 12/09/2017 1116 Last data filed at 12/09/2017 0900 Gross per 24 hour  Intake 822 ml  Output -  Net 822 ml   Filed Weights   12/08/17 1540 12/08/17 2025 12/09/17 0514  Weight: 85.7 kg 88.5 kg 88.5 kg    Telemetry    Multiple 5-second pauses in atrial flutter on telemetry- Personally Reviewed  ECG    Atrial flutter with slow ventricular response- Personally Reviewed  Physical Exam  Constitutional: No acute distress ENMT: normal dentition, moist mucous membranes Cardiovascular: irregular rhythm, rates 29-70, no murmurs. S1 and S2 normal. Radial pulses normal bilaterally. No jugular venous distention.  Respiratory: clear to auscultation bilaterally GI : normal bowel sounds, soft and nontender. No distention.   MSK: extremities warm, well perfused. No edema.  NEURO: grossly nonfocal exam, moves all extremities. PSYCH: alert and oriented x 3, normal mood and affect.   Labs    Chemistry Recent Labs  Lab 12/08/17 1632 12/08/17 2111 12/09/17 0325  NA 140  --  140  K 3.7  --  3.5  CL 106  --  107  CO2 27  --  25  GLUCOSE 87  --  76  BUN 14  --  11  CREATININE 1.15  --  0.92    CALCIUM 9.1  --  8.5*  PROT  --  5.9*  --   ALBUMIN  --  3.7  --   AST  --  17  --   ALT  --  20  --   ALKPHOS  --  31*  --   BILITOT  --  0.9  --   GFRNONAA >60  --  >60  GFRAA >60  --  >60  ANIONGAP 7  --  8     Hematology Recent Labs  Lab 12/08/17 1632 12/09/17 0325  WBC 9.1 8.1  RBC 5.05 4.75  HGB 16.6 15.4  HCT 50.1 45.9  MCV 99.2 96.6  MCH 32.9 32.4  MCHC 33.1 33.6  RDW 14.1 13.9  PLT 222 208    Cardiac Enzymes Recent Labs  Lab 12/08/17 2111 12/09/17 0325  TROPONINI <0.03 <0.03    Recent Labs  Lab 12/08/17 1640  TROPIPOC 0.01     BNPNo results for input(s): BNP, PROBNP in the last 168 hours.   DDimer No results for input(s): DDIMER in the last 168 hours.   Radiology    Dg Chest Port 1 View  Result Date: 12/08/2017 CLINICAL DATA:  Initial evaluation for acute chest pain. EXAM: PORTABLE CHEST 1 VIEW COMPARISON:  Prior radiograph 09/18/2015. FINDINGS: Extent to a  shin of the cardiac silhouette related AP technique. Defibrillator pads overlie the left and lower chest. Mediastinal silhouette within normal limits. Lungs normally inflated. No focal infiltrates. No pulmonary edema or pleural effusion. No pneumothorax. No acute osseus abnormality. IMPRESSION: No active cardiopulmonary disease. Electronically Signed   By: Rise Mu M.D.   On: 12/08/2017 16:59    Cardiac Studies   n/a  Patient Profile     57 y.o. male with a history of atrial flutter, previously successfully cardioverted, now having atrial flutter with slow ventricular response and syncope.  Assessment & Plan    Active Problems:   Atrial flutter (HCC)   Syncope   Slow ventricular response  We are continuing beta-blockade washout, however since yesterday evening his pauses have continued, with telemetry documenting several greater than 5 seconds pauses in atrial flutter.  We will continue observation with careful monitoring of symptoms.  No changes to his medications at this  time.  We have discussed the rationale behind monitoring with symptomatic a flutter with slow ventricular response.  After approximately 48 hours off of beta-blockade, it would be reasonable to involve our colleagues in EP for further decision-making with regard to procedural management if indicated.  Signed, Parke Poisson, MD  12/09/2017, 11:16 AM

## 2017-12-09 NOTE — Plan of Care (Signed)
  Problem: Clinical Measurements: Goal: Diagnostic test results will improve Outcome: Progressing Goal: Cardiovascular complication will be avoided Outcome: Progressing   Problem: Coping: Goal: Level of anxiety will decrease Outcome: Completed/Met

## 2017-12-10 ENCOUNTER — Inpatient Hospital Stay (HOSPITAL_COMMUNITY): Payer: BC Managed Care – PPO

## 2017-12-10 ENCOUNTER — Encounter (HOSPITAL_COMMUNITY)
Admission: EM | Disposition: A | Discharge status: Home / Self Care | Payer: Self-pay | Source: Home / Self Care | Attending: Internal Medicine

## 2017-12-10 ENCOUNTER — Encounter (HOSPITAL_COMMUNITY): Payer: Self-pay | Admitting: Internal Medicine

## 2017-12-10 DIAGNOSIS — I4892 Unspecified atrial flutter: Secondary | ICD-10-CM

## 2017-12-10 DIAGNOSIS — I459 Conduction disorder, unspecified: Secondary | ICD-10-CM

## 2017-12-10 DIAGNOSIS — I517 Cardiomegaly: Secondary | ICD-10-CM

## 2017-12-10 DIAGNOSIS — I483 Typical atrial flutter: Secondary | ICD-10-CM

## 2017-12-10 HISTORY — PX: TEE WITHOUT CARDIOVERSION: SHX5443

## 2017-12-10 HISTORY — PX: A-FLUTTER ABLATION: EP1230

## 2017-12-10 LAB — BASIC METABOLIC PANEL
Anion gap: 8 (ref 5–15)
BUN: 12 mg/dL (ref 6–20)
CHLORIDE: 109 mmol/L (ref 98–111)
CO2: 22 mmol/L (ref 22–32)
Calcium: 8.6 mg/dL — ABNORMAL LOW (ref 8.9–10.3)
Creatinine, Ser: 0.93 mg/dL (ref 0.61–1.24)
GFR calc Af Amer: >60 mL/min (ref >60)
GLUCOSE: 90 mg/dL (ref 70–99)
POTASSIUM: 4.1 mmol/L (ref 3.5–5.1)
Sodium: 139 mmol/L (ref 135–145)

## 2017-12-10 LAB — MAGNESIUM: MAGNESIUM: 2.2 mg/dL (ref 1.7–2.4)

## 2017-12-10 SURGERY — A-FLUTTER ABLATION

## 2017-12-10 MED ORDER — BUPIVACAINE HCL (PF) 0.25 % IJ SOLN
INTRAMUSCULAR | Status: AC
  Filled 2017-12-10: qty 30

## 2017-12-10 MED ORDER — FENTANYL CITRATE (PF) 100 MCG/2ML IJ SOLN
INTRAMUSCULAR | Status: AC
  Filled 2017-12-10: qty 2

## 2017-12-10 MED ORDER — FENTANYL CITRATE (PF) 100 MCG/2ML IJ SOLN
INTRAMUSCULAR | Status: DC | PRN
  Administered 2017-12-10 (×2): 12.5 ug via INTRAVENOUS
  Administered 2017-12-10: 25 ug via INTRAVENOUS
  Administered 2017-12-10: 12.5 ug via INTRAVENOUS
  Administered 2017-12-10: 25 ug via INTRAVENOUS
  Administered 2017-12-10 (×2): 12.5 ug via INTRAVENOUS
  Administered 2017-12-10: 25 ug via INTRAVENOUS
  Administered 2017-12-10: 50 ug via INTRAVENOUS
  Administered 2017-12-10: 25 ug via INTRAVENOUS
  Administered 2017-12-10: 50 ug via INTRAVENOUS
  Administered 2017-12-10: 25 ug via INTRAVENOUS
  Administered 2017-12-10: 12.5 ug via INTRAVENOUS

## 2017-12-10 MED ORDER — SODIUM CHLORIDE 0.9 % IV SOLN
INTRAVENOUS | Status: DC
  Administered 2017-12-10: 12:00:00 via INTRAVENOUS

## 2017-12-10 MED ORDER — MIDAZOLAM HCL 5 MG/5ML IJ SOLN
INTRAMUSCULAR | Status: AC
  Filled 2017-12-10: qty 5

## 2017-12-10 MED ORDER — SODIUM CHLORIDE 0.9 % IV SOLN: 250.0000 mL | INTRAVENOUS | Status: DC | PRN

## 2017-12-10 MED ORDER — HEPARIN SODIUM (PORCINE) 1000 UNIT/ML IJ SOLN
INTRAMUSCULAR | Status: DC | PRN
  Administered 2017-12-10: 1000 [IU] via INTRAVENOUS

## 2017-12-10 MED ORDER — MAGNESIUM HYDROXIDE 400 MG/5ML PO SUSP
30.0000 mL | Freq: Every day | ORAL | Status: DC | PRN
  Administered 2017-12-10: 30 mL via ORAL
  Filled 2017-12-10 (×2): qty 30

## 2017-12-10 MED ORDER — SODIUM CHLORIDE 0.9% FLUSH: 3.0000 mL | INTRAVENOUS | Status: DC | PRN

## 2017-12-10 MED ORDER — MIDAZOLAM HCL 5 MG/5ML IJ SOLN
INTRAMUSCULAR | Status: DC | PRN
  Administered 2017-12-10: 2 mg via INTRAVENOUS
  Administered 2017-12-10: 1 mg via INTRAVENOUS
  Administered 2017-12-10: 2 mg via INTRAVENOUS
  Administered 2017-12-10: 1 mg via INTRAVENOUS
  Administered 2017-12-10 (×2): 2 mg via INTRAVENOUS
  Administered 2017-12-10: 1 mg via INTRAVENOUS
  Administered 2017-12-10 (×2): 2 mg via INTRAVENOUS
  Administered 2017-12-10 (×4): 1 mg via INTRAVENOUS

## 2017-12-10 MED ORDER — BUPIVACAINE HCL (PF) 0.25 % IJ SOLN
INTRAMUSCULAR | Status: DC | PRN
  Administered 2017-12-10: 30 mL

## 2017-12-10 MED ORDER — HEPARIN (PORCINE) IN NACL 1000-0.9 UT/500ML-% IV SOLN
INTRAVENOUS | Status: AC
  Filled 2017-12-10: qty 500

## 2017-12-10 MED ORDER — HEPARIN (PORCINE) IN NACL 1000-0.9 UT/500ML-% IV SOLN
INTRAVENOUS | Status: DC | PRN
  Administered 2017-12-10: 500 mL

## 2017-12-10 MED ORDER — ONDANSETRON HCL 4 MG/2ML IJ SOLN: 4.0000 mg | Freq: Four times a day (QID) | INTRAMUSCULAR | Status: DC | PRN

## 2017-12-10 MED ORDER — ACETAMINOPHEN 325 MG PO TABS: 650.0000 mg | ORAL_TABLET | ORAL | Status: DC | PRN

## 2017-12-10 MED ORDER — SODIUM CHLORIDE 0.9% FLUSH
3.0000 mL | Freq: Two times a day (BID) | INTRAVENOUS | Status: DC
  Administered 2017-12-10 – 2017-12-11 (×3): 3 mL via INTRAVENOUS

## 2017-12-10 SURGICAL SUPPLY — 11 items
BAG SNAP BAND KOVER 36X36 (MISCELLANEOUS) ×4 IMPLANT
CATH JOSEPH QUAD ALLRED 6F REP (CATHETERS) ×4 IMPLANT
CATH SMTCH THERMOCOOL SF FJ (CATHETERS) ×4 IMPLANT
CATH WEB BIDIR CS D-F NONAUTO (CATHETERS) ×4 IMPLANT
PACK EP LATEX FREE (CUSTOM PROCEDURE TRAY) ×2
PACK EP LF (CUSTOM PROCEDURE TRAY) ×2 IMPLANT
PAD PRO RADIOLUCENT 2001M-C (PAD) ×4 IMPLANT
PATCH CARTO3 (PAD) ×4 IMPLANT
SHEATH PINNACLE 7F 10CM (SHEATH) ×8 IMPLANT
SHEATH PINNACLE 8F 10CM (SHEATH) ×4 IMPLANT
TUBING SMART ABLATE COOLFLOW (TUBING) ×4 IMPLANT

## 2017-12-10 NOTE — CV Procedure (Signed)
INDICATIONS: atrial flutter, r/o LAA  clot  PROCEDURE:   Informed consent was obtained prior to the procedure. The risks, benefits and alternatives for the procedure were discussed and the patient comprehended these risks.  Risks include, but are not limited to, cough, sore throat, vomiting, nausea, somnolence, esophageal and stomach trauma or perforation, bleeding, low blood pressure, aspiration, pneumonia, infection, trauma to the teeth and death.    After a procedural time-out, moderate sedation was administered.  During this procedure the patient was administered a total of Versed 5 mg and Fentanyl 125  mg to achieve and maintain moderate conscious sedation.  The patient's heart rate, blood pressure, and oxygen saturationweare monitored continuously during the procedure. The period of conscious sedation was  15 minutes, of which I was present face-to-face 100% of this time.  The transesophageal probe was inserted in the esophagus and stomach without difficulty and multiple views were obtained.  The patient was kept under observation until the patient left the procedure room.  The patient left the procedure room in stable condition.   Agitated microbubble saline contrast was not administered.  COMPLICATIONS:    There were no immediate complications.  FINDINGS:  Study Conclusions  - Limited transesophageal echocardiogram to evaluate for left   atrial appendage, left atrial, or left ventricular thrombus. - Left atrium: The atrium was dilated. No evidence of thrombus in   the atrial cavity or appendage. - Left ventricle: The estimated ejection fraction was 50%. - Mitral valve: There was trivial regurgitation. - Right atrium: The atrium was dilated. - Atrial septum: Significant atrial septal movement with   variability (likely respiratory), a patent foramen ovale cannot   be excluded. Color Doppler shows possibly tiny Left to Right   shunt at rest. Agitated saline not performed per  referring   provider preference. - Pericardium, extracardiac: There was no pericardial effusion.  RECOMMENDATIONS:    Per EP team  Time Spent Directly with the Patient:  30 minutes   Parke Poisson 12/10/2017, 10:59 PM

## 2017-12-10 NOTE — Progress Notes (Signed)
Site area: Right groin a 7 french X2 and a 9 french  venous sheaths were removed  Site Prior to Removal:  Level 0  Pressure Applied For 20 MINUTES    Bedrest Beginning at   Manual:   Yes.    Patient Status During Pull:  stable  Post Pull Groin Site:  Level 0  Post Pull Instructions Given:  Yes.    Post Pull Pulses Present:  Yes.    Dressing Applied:  Yes.    Comments:  VS remain stable

## 2017-12-10 NOTE — Progress Notes (Signed)
Pt remains in atrial flutter and continues to experience 3-5 second pauses noted on telemetry.

## 2017-12-10 NOTE — Consult Note (Addendum)
ELECTROPHYSIOLOGY CONSULT NOTE    Patient ID: Marvin Simmons MRN: [id number], DOB/AGE: 03/01/1960 58 y.o.  Admit date: 12/08/2017 Date of Consult: 12/10/2017  Primary Physician: [facility] Primary Cardiologist: Marvin Simmons Electrophysiologist: Ladona Ridgel  Patient Profile: Marvin Simmons is a 57 y.o. male with a history of obesity, ETOH abuse and atrial flutter who is being seen today for the evaluation of atrial flutter and tachybrady syndrome at the request of Marvin Simmons.  HPI:  Marvin Simmons is a 57 y.o. male with the above past medical history.  He was first diagnosed with atrial flutter in 2017 and underwent cardioversion at that time.  He then had recurrent atrial flutter in 12/2016 and again underwent cardioversion. He did well until recently when he developed recurrent atrial flutter. He was seen in the office 10/10 and compliance with Eliquis was stressed.  He had missed some doses leading up to that and therefore was scheduled for cardioversion in 3 weeks. His metoprolol was increased at that time.  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 He was transported to [facility] and his BB has been allowed to wash out.  He has subsequently developed rates in the 150's.  EP has been asked to evaluate for treatment options.   He denies chest pain, palpitations, dyspnea, PND, orthopnea, nausea, vomiting, dizziness, syncope, edema, weight gain, or early satiety.  Past Medical History:  Diagnosis Date  . Atrial flutter (HCC) 2017  . Morbid obesity (HCC) 12/31/2016     Surgical History:  Past Surgical History:  Procedure Laterality Date  . CARDIOVERSION N/A 03/07/2017   Procedure: CARDIOVERSION;  Surgeon: Thurmon Fair, MD;  Location: [facility] ENDOSCOPY;  Service: Cardiovascular;  Laterality: N/A;  . SHOULDER SURGERY Right   . TEE WITHOUT CARDIOVERSION N/A 03/07/2017   Procedure: TRANSESOPHAGEAL  ECHOCARDIOGRAM (TEE);  Surgeon: Thurmon Fair, MD;  Location: [facility] ENDOSCOPY;  Service: Cardiovascular;  Laterality: N/A;  . TONSILLECTOMY       Medications Prior to Admission  Medication Sig Dispense Refill Last Dose  . albuterol (PROAIR HFA) 108 (90 Base) MCG/ACT inhaler Inhale 2 puffs into the lungs every 6 (six) hours as needed for wheezing or shortness of breath.    year ago  . apixaban (ELIQUIS) 5 MG TABS tablet Take 1 tablet (5 mg total) by mouth 2 (two) times daily. 180 tablet 3 12/08/2017 at 700  . ibuprofen (ADVIL,MOTRIN) 200 MG tablet Take 400 mg by mouth every 6 (six) hours as needed for headache or moderate pain.   few weeks ago  . metoprolol succinate (TOPROL-XL) 50 MG 24 hr tablet Take 1 tablet (50 mg total) by mouth 2 (two) times daily. 180 tablet 3 12/08/2017 at 700  . mometasone (NASONEX) 50 MCG/ACT nasal spray Place 2 sprays into the nose 2 (two) times daily as needed (seasonal allergies).    6 months ago  . sildenafil (VIAGRA) 50 MG tablet Take 50 mg by mouth daily as needed for erectile dysfunction.   week ago  . testosterone cypionate (DEPOTESTOSTERONE CYPIONATE) 200 MG/ML injection Inject 200 mg into the muscle every 14 (fourteen) days.   12/03/2017    Inpatient Medications:  . apixaban  5 mg Oral BID  . Influenza vac split quadrivalent PF  0.5 mL Intramuscular Tomorrow-1000    Allergies: No Known Allergies  Social History   Socioeconomic History  . Marital status: Married    Spouse name:  Not on file  . Number of children: Not on file  . Years of education: Not on file  . Highest education level: Not on file  Occupational History  . Not on file  Social Needs  . Financial resource strain: Not on file  . Food insecurity:    Worry: Not on file    Inability: Not on file  . Transportation needs:    Medical: Not on file    Non-medical: Not on file  Tobacco Use  . Smoking status: Former Smoker    Last attempt to quit: 09/18/1990    Years since quitting: 27.2    . Smokeless tobacco: Former Neurosurgeon    Types: Snuff    Quit date: 09/18/1990  Substance and Sexual Activity  . Alcohol use: Yes    Comment: avg 4oz alcohol/day  . Drug use: No  . Sexual activity: Not on file  Lifestyle  . Physical activity:    Days per week: Not on file    Minutes per session: Not on file  . Stress: Not on file  Relationships  . Social connections:    Talks on phone: Not on file    Gets together: Not on file    Attends religious service: Not on file    Active member of club or organization: Not on file    Attends meetings of clubs or organizations: Not on file    Relationship status: Not on file  . Intimate partner violence:    Fear of current or ex partner: Not on file    Emotionally abused: Not on file    Physically abused: Not on file    Forced sexual activity: Not on file  Other Topics Concern  . Not on file  Social History Narrative  . Not on file     Family History  Problem Relation Age of Onset  . Emphysema Mother   . Lactose intolerance Father      Review of Systems: All other systems reviewed and are otherwise negative except as noted above.  Physical Exam: Vitals:   12/09/17 1930 12/10/17 0013 12/10/17 0345 12/10/17 0820  BP: 103/85 110/72 111/82 113/69  Pulse: 95 (!) 53 89 78  Resp:    14  Temp: (!) 97.5 F (36.4 C)  97.8 F (36.6 C) 97.6 F (36.4 C)  TempSrc: Oral  Oral Oral  SpO2: 96% 97% 96% 97%  Weight:   87.6 kg     GEN- The patient is well appearing, alert and oriented x 3 today.   HEENT: normocephalic, atraumatic; sclera clear, conjunctiva pink; hearing intact; oropharynx clear; neck supple Lungs- Clear to ausculation bilaterally, normal work of breathing.  No wheezes, rales, rhonchi Heart- Regular rate and rhythm, no murmurs, rubs or gallops GI- soft, non-tender, non-distended, bowel sounds present Extremities- no clubbing, cyanosis, or edema; DP/PT/radial pulses 2+ bilaterally MS- no significant deformity or  atrophy Skin- warm and dry, no rash or lesion Psych- euthymic mood, full affect Neuro- strength and sensation are intact  Labs:   Lab Results  Component Value Date   WBC 8.1 12/09/2017   HGB 15.4 12/09/2017   HCT 45.9 12/09/2017   MCV 96.6 12/09/2017   PLT 208 12/09/2017    Recent Labs  Lab 12/08/17 2111  12/10/17 0332  NA  --    < > 139  K  --    < > 4.1  CL  --    < > 109  CO2  --    < >  22  BUN  --    < > 12  CREATININE  --    < > 0.93  CALCIUM  --    < > 8.6*  PROT 5.9*  --   --   BILITOT 0.9  --   --   ALKPHOS 31*  --   --   ALT 20  --   --   AST 17  --   --   GLUCOSE  --    < > 90   < > = values in this interval not displayed.      Radiology/Studies: Dg Chest Port 1 View  Result Date: 12/08/2017 CLINICAL DATA:  Initial evaluation for acute chest pain. EXAM: PORTABLE CHEST 1 VIEW COMPARISON:  Prior radiograph 09/18/2015. FINDINGS: Extent to a shin of the cardiac silhouette related AP technique. Defibrillator pads overlie the left and lower chest. Mediastinal silhouette within normal limits. Lungs normally inflated. No focal infiltrates. No pulmonary edema or pleural effusion. No pneumothorax. No acute osseus abnormality. IMPRESSION: No active cardiopulmonary disease. Electronically Signed   By: Rise Mu M.D.   On: 12/08/2017 16:59    [id number] typical atrial flutter (personally reviewed)  TELEMETRY: atrial flutter, V rates up to 150's, up to 5 second pauses  (personally reviewed)   Assessment/Plan: 1.  Reverse typical atrial flutter He has recurrent symptomatic reverse typical atrial flutter. Treatment options discussed at length with patient today. Would recommend definitive treatment with catheter ablation. Will need TEE prior to procedure to exclude left atrial thrombus.  Risks, benefits reviewed with patient who wishes to proceed.  2.  Pauses/syncope His rates in SR have been normal Hopefully can avoid pacemaker Will evaluate further  following ablation Patient is aware of possibility that he will need pacemaker following ablation and agrees to proceed if needed      For questions or updates, please contact [facility] Please consult [email or web address] for contact info under Cardiology/STEMI.  Signed, Gypsy Balsam, NP 12/10/2017 9:22 AM  EP Attending  Patient seen and examined. Chart reviewed. He has reverse typical atrial flutter with a slow VR. In addition he has pauses. He has had syncope. He has had dramatically slow VR in the hospital on metoprolol. I have discussed the treatment options with the patient and the risks/benefits/goals/expectations of the procedure were reviewed and he wishes to proceed. I specifically discussed the possibility of needing a PPM as he has evidence of intrinsic conduction disease. He understands and is willing to have a PPM if needed.   Leonia Reeves.D.

## 2017-12-10 NOTE — Progress Notes (Signed)
  Echocardiogram 2D Echocardiogram has been performed.  Belva Chimes 12/10/2017, 10:52 AM

## 2017-12-10 NOTE — Discharge Summary (Addendum)
ELECTROPHYSIOLOGY PROCEDURE DISCHARGE SUMMARY    Patient ID: Marvin Simmons,  MRN: [id number], DOB/AGE: 1960-11-07 57 y.o.  Admit date: 12/08/2017 Discharge date: 12/11/2017  Primary Care Physician: [facility]  Primary Cardiologist: Katrinka Blazing Electrophysiologist: Ladona Ridgel  Primary Discharge Diagnosis:  Atrial flutter status post ablation this admission Pauses with associated syncope  Secondary Discharge Diagnosis:  1.  ETOH abuse 2.  Obesity  No Known Allergies   Procedures This Admission: 1.  Electrophysiology study and radiofrequency catheter ablation on 12/10/17 by Dr Ladona Ridgel.  This study demonstrated reverse typical atrial flutter with successful CTI ablation.  There were no inducible arrhythmias following ablation and no early apparent complications. TEE prior to the procedure demonstrated no LAA thrombus   Brief HPI/Hospital Course:  Marvin Simmons is a 57 y.o. male with the above past medical history.  He was first diagnosed with atrial flutter in 2017 and underwent cardioversion at that time.  He then had recurrent atrial flutter in 12/2016 and again underwent cardioversion. He did well until recently when he developed recurrent atrial flutter. He was seen in the office 10/10 and compliance with Eliquis was stressed.  He had missed some doses leading up to that and therefore was scheduled for cardioversion in 3 weeks. His metoprolol was increased at that time.  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 He was transported to [facility] and his BB has been allowed to wash out.  He has subsequently developed rates in the 150's. He was seen by EP and TEE/flutter ablation was recommended. Risks, benefits, and alternatives to ablation were reviewed with the patient who wished to proceed. The patient underwent EPS/RFCA with details as outlined above. He was monitored on telemetry  overnight which demonstrated sinus rhythm.  Groin incision was without complication.  They were examined by Dr Elberta Fortis who considered them stable for discharge to home.  Follow up will be arranged in 4 weeks.  Wound care and restrictions were reviewed with the patient prior to discharge.   Physical Exam: Vitals:   12/10/17 1948 12/11/17 0048 12/11/17 0652 12/11/17 0752  BP: 117/86 109/74 117/72 125/79  Pulse: 86 79 77 76  Resp:      Temp: 98.1 F (36.7 C) 98.9 F (37.2 C) 98.7 F (37.1 C)   TempSrc: Oral Oral Oral   SpO2: 96% 96% 95% 95%  Weight:   90 kg     GEN- The patient is well appearing, alert and oriented x 3 today.   HEENT: normocephalic, atraumatic; sclera clear, conjunctiva pink; hearing intact; oropharynx clear; neck supple  Lungs- Clear to ausculation bilaterally, normal work of breathing.  No wheezes, rales, rhonchi Heart- Regular rate and rhythm  GI- soft, non-tender, non-distended, bowel sounds present  Extremities- no clubbing, cyanosis, or edema; groin without hematoma/bruit  MS- no significant deformity or atrophy Skin- warm and dry, no rash or lesion Psych- euthymic mood, full affect Neuro- strength and sensation are intact   Labs:   Lab Results  Component Value Date   WBC 8.1 12/09/2017   HGB 15.4 12/09/2017   HCT 45.9 12/09/2017   MCV 96.6 12/09/2017   PLT 208 12/09/2017    Recent Labs  Lab 12/08/17 2111  12/11/17 0506  NA  --    < > 136  K  --    < > 4.6  CL  --    < >  106  CO2  --    < > 25  BUN  --    < > 13  CREATININE  --    < > 0.89  CALCIUM  --    < > 8.3*  PROT 5.9*  --   --   BILITOT 0.9  --   --   ALKPHOS 31*  --   --   ALT 20  --   --   AST 17  --   --   GLUCOSE  --    < > 86   < > = values in this interval not displayed.    Discharge Medications:  Allergies as of 12/11/2017   No Known Allergies     Medication List    STOP taking these medications   metoprolol succinate 50 MG 24 hr tablet Commonly known as:   TOPROL-XL     TAKE these medications   apixaban 5 MG Tabs tablet Commonly known as:  ELIQUIS Take 1 tablet (5 mg total) by mouth 2 (two) times daily.   ibuprofen 200 MG tablet Commonly known as:  ADVIL,MOTRIN Take 400 mg by mouth every 6 (six) hours as needed for headache or moderate pain.   mometasone 50 MCG/ACT nasal spray Commonly known as:  NASONEX Place 2 sprays into the nose 2 (two) times daily as needed (seasonal allergies).   PROAIR HFA 108 (90 Base) MCG/ACT inhaler Generic drug:  albuterol Inhale 2 puffs into the lungs every 6 (six) hours as needed for wheezing or shortness of breath.   sildenafil 50 MG tablet Commonly known as:  VIAGRA Take 50 mg by mouth daily as needed for erectile dysfunction.   testosterone cypionate 200 MG/ML injection Commonly known as:  DEPOTESTOSTERONE CYPIONATE Inject 200 mg into the muscle every 14 (fourteen) days.       Disposition:  Discharge Instructions    Diet - low sodium heart healthy   Complete by:  As directed    Increase activity slowly   Complete by:  As directed      Follow-up Information    Marinus Maw, MD Follow up on 01/15/2018.   Specialty:  Cardiology Why:  at 12:30PM  Contact information: [address] [place] [address] [phone]           Duration of Discharge Encounter: Greater than 30 minutes including physician time.  Signed, Gypsy Balsam, NP 12/11/2017 8:28 AM   I have seen, examined the patient, and reviewed the above assessment and plan.  Changes to above are made where necessary.  On exam, RRR.  Doing well s/p ablation.  DC to home.  Follow-up with Dr Ladona Ridgel in 4 weeks.  Co Sign: Hillis Range, MD 12/11/2017 10:12 AM

## 2017-12-10 NOTE — Progress Notes (Signed)
Progress Note  Patient Name: Marvin Simmons Date of Encounter: 12/10/2017  Primary Cardiologist: Lesleigh Noe, MD   Subjective   Denies symptoms of lightheadedness, presyncope, syncope overnight. HR has increased with BB washout.  Inpatient Medications    Scheduled Meds: . [MAR Hold] apixaban  5 mg Oral BID  . [MAR Hold] Influenza vac split quadrivalent PF  0.5 mL Intramuscular Tomorrow-1000   Continuous Infusions:  . sodium chloride 50 mL/hr at 12/10/17 1204   PRN Meds: [MAR Hold] acetaminophen, [MAR Hold] magnesium hydroxide, [MAR Hold] ondansetron (ZOFRAN) IV, ondansetron (ZOFRAN) IV   Vital Signs    Vitals:   12/10/17 1255 12/10/17 1300 12/10/17 1305 12/10/17 1310  BP: 120/73 108/70 104/73 112/69  Pulse: 85 65 65 79  Resp: 10 16 13 19   Temp:      TempSrc:      SpO2: 94% 99% 97% 96%  Weight:        Intake/Output Summary (Last 24 hours) at 12/10/2017 1322 Last data filed at 12/09/2017 2000 Gross per 24 hour  Intake 480 ml  Output -  Net 480 ml   Filed Weights   12/08/17 2025 12/09/17 0514 12/10/17 0345  Weight: 88.5 kg 88.5 kg 87.6 kg    Telemetry    Occasional pauses during afternoon yesterday, then rates increased to 120-150 overnight into today - Personally Reviewed  ECG    Atrial flutter with slow ventricular response- Personally Reviewed  Physical Exam  Constitutional: No acute distress ENMT: normal dentition, moist mucous membranes Cardiovascular: irregular rhythm, tachycardic, no murmurs. S1 and S2 normal. Radial pulses normal bilaterally. No jugular venous distention.  Respiratory: clear to auscultation bilaterally GI : normal bowel sounds, soft and nontender. No distention.   MSK: extremities warm, well perfused. No edema.  NEURO: grossly nonfocal exam, moves all extremities. PSYCH: alert and oriented x 3, normal mood and affect.   Labs    Chemistry Recent Labs  Lab 12/08/17 1632 12/08/17 2111 12/09/17 0325 12/10/17 0332    NA 140  --  140 139  K 3.7  --  3.5 4.1  CL 106  --  107 109  CO2 27  --  25 22  GLUCOSE 87  --  76 90  BUN 14  --  11 12  CREATININE 1.15  --  0.92 0.93  CALCIUM 9.1  --  8.5* 8.6*  PROT  --  5.9*  --   --   ALBUMIN  --  3.7  --   --   AST  --  17  --   --   ALT  --  20  --   --   ALKPHOS  --  31*  --   --   BILITOT  --  0.9  --   --   GFRNONAA >60  --  >60 >60  GFRAA >60  --  >60 >60  ANIONGAP 7  --  8 8     Hematology Recent Labs  Lab 12/08/17 1632 12/09/17 0325  WBC 9.1 8.1  RBC 5.05 4.75  HGB 16.6 15.4  HCT 50.1 45.9  MCV 99.2 96.6  MCH 32.9 32.4  MCHC 33.1 33.6  RDW 14.1 13.9  PLT 222 208    Cardiac Enzymes Recent Labs  Lab 12/08/17 2111 12/09/17 0325  TROPONINI <0.03 <0.03    Recent Labs  Lab 12/08/17 1640  TROPIPOC 0.01     BNPNo results for input(s): BNP, PROBNP in the last 168 hours.  DDimer No results for input(s): DDIMER in the last 168 hours.   Radiology    Dg Chest Port 1 View  Result Date: 12/08/2017 CLINICAL DATA:  Initial evaluation for acute chest pain. EXAM: PORTABLE CHEST 1 VIEW COMPARISON:  Prior radiograph 09/18/2015. FINDINGS: Extent to a shin of the cardiac silhouette related AP technique. Defibrillator pads overlie the left and lower chest. Mediastinal silhouette within normal limits. Lungs normally inflated. No focal infiltrates. No pulmonary edema or pleural effusion. No pneumothorax. No acute osseus abnormality. IMPRESSION: No active cardiopulmonary disease. Electronically Signed   By: Rise Mu M.D.   On: 12/08/2017 16:59    Cardiac Studies   n/a  Patient Profile     57 y.o. male with a history of atrial flutter, previously successfully cardioverted, now having atrial flutter with slow ventricular response and syncope.  Assessment & Plan    Active Problems:   Atrial flutter (HCC)   Syncope   Slow ventricular response  EP has been consulted, I appreciate their guidance in Mr. Haile's care. I will  perform a limited TEE for LAA clot prior to ablation with Dr. Ladona Ridgel today. Flutter is a reverse typical flutter. His rates have increased to RVR in flutter, suggesting an intact AV node, however we will observe him overnight after ablation to ensure appropriate AV nodal conduction.  Continue apixaban for anticoagulation.   Signed, Parke Poisson, MD  12/10/2017, 1:22 PM

## 2017-12-10 NOTE — Plan of Care (Signed)
  Problem: Health Behavior/Discharge Planning: Goal: Ability to manage health-related needs will improve Outcome: Completed/Met   Problem: Clinical Measurements: Goal: Ability to maintain clinical measurements within normal limits will improve Outcome: Completed/Met   Problem: Activity: Goal: Risk for activity intolerance will decrease Outcome: Completed/Met

## 2017-12-11 ENCOUNTER — Encounter: Payer: Self-pay | Admitting: Nurse Practitioner

## 2017-12-11 LAB — BASIC METABOLIC PANEL
Anion gap: 5 (ref 5–15)
BUN: 13 mg/dL (ref 6–20)
CHLORIDE: 106 mmol/L (ref 98–111)
CO2: 25 mmol/L (ref 22–32)
CREATININE: 0.89 mg/dL (ref 0.61–1.24)
Calcium: 8.3 mg/dL — ABNORMAL LOW (ref 8.9–10.3)
GFR calc Af Amer: >60 mL/min (ref >60)
GFR calc non Af Amer: >60 mL/min (ref >60)
Glucose, Bld: 86 mg/dL (ref 70–99)
Potassium: 4.6 mmol/L (ref 3.5–5.1)
Sodium: 136 mmol/L (ref 135–145)

## 2017-12-22 ENCOUNTER — Other Ambulatory Visit: Payer: BC Managed Care – PPO

## 2017-12-23 ENCOUNTER — Institutional Professional Consult (permissible substitution): Payer: BC Managed Care – PPO | Admitting: Cardiology

## 2017-12-23 ENCOUNTER — Other Ambulatory Visit: Payer: BC Managed Care – PPO

## 2017-12-29 ENCOUNTER — Ambulatory Visit (HOSPITAL_COMMUNITY): Admission: RE | Admit: 2017-12-29 | Payer: BC Managed Care – PPO | Source: Ambulatory Visit | Admitting: Cardiology

## 2017-12-29 ENCOUNTER — Encounter (HOSPITAL_COMMUNITY): Admission: RE | Payer: Self-pay | Source: Ambulatory Visit

## 2017-12-29 SURGERY — CARDIOVERSION
Anesthesia: General

## 2018-01-15 ENCOUNTER — Ambulatory Visit: Payer: BC Managed Care – PPO | Admitting: Cardiology

## 2018-01-15 ENCOUNTER — Encounter: Payer: Self-pay | Admitting: Internal Medicine

## 2018-01-15 ENCOUNTER — Ambulatory Visit: Payer: BC Managed Care – PPO | Admitting: Internal Medicine

## 2018-01-15 VITALS — BP 122/74 | HR 70 | Ht 67.0 in | Wt 184.6 lb

## 2018-01-15 DIAGNOSIS — I483 Typical atrial flutter: Secondary | ICD-10-CM

## 2018-01-15 NOTE — Addendum Note (Signed)
Addended by: Solon AugustaBOYD, Ilene Witcher H on: 01/15/2018 04:54 PM   Modules accepted: Orders

## 2018-01-15 NOTE — Patient Instructions (Addendum)
Medication Instructions:  Your physician has recommended you make the following change in your medication:   STOP: eliquis   Labwork: None ordered.  Testing/Procedures: None ordered.  Follow-Up: AS NEEDED  Any Other Special Instructions Will Be Listed Below (If Applicable).  If you need a refill on your cardiac medications before your next appointment, please call your pharmacy.

## 2018-01-15 NOTE — Progress Notes (Signed)
HPI Mr. Rosengrant returns today for followup of atrial flutter, s/p ablation. He underwent EPS/RFA of reverse typical atrial flutter. He has had rare palpitations. He has an Apple watch and has checked his ECG which shows no arrhythmias. He has continued to take his Eliquis.   No Known Allergies   Current Outpatient Medications  Medication Sig Dispense Refill  . albuterol (PROAIR HFA) 108 (90 Base) MCG/ACT inhaler Inhale 2 puffs into the lungs every 6 (six) hours as needed for wheezing or shortness of breath.     Marland Kitchen ibuprofen (ADVIL,MOTRIN) 200 MG tablet Take 400 mg by mouth every 6 (six) hours as needed for headache or moderate pain.    . mometasone (NASONEX) 50 MCG/ACT nasal spray Place 2 sprays into the nose 2 (two) times daily as needed (seasonal allergies).     . sildenafil (VIAGRA) 50 MG tablet Take 50 mg by mouth daily as needed for erectile dysfunction.    Marland Kitchen testosterone cypionate (DEPOTESTOSTERONE CYPIONATE) 200 MG/ML injection Inject 200 mg into the muscle every 14 (fourteen) days.     No current facility-administered medications for this visit.      Past Medical History:  Diagnosis Date  . Atrial flutter (HCC) 2017  . Morbid obesity (HCC) 12/31/2016    ROS:   All systems reviewed and negative except as noted in the HPI.   Past Surgical History:  Procedure Laterality Date  . A-FLUTTER ABLATION N/A 12/10/2017   Procedure: A-FLUTTER ABLATION;  Surgeon: Marinus Maw, MD;  Location: [facility] INVASIVE CV LAB;  Service: Cardiovascular;  Laterality: N/A;  . CARDIOVERSION N/A 03/07/2017   Procedure: CARDIOVERSION;  Surgeon: Thurmon Fair, MD;  Location: [facility] ENDOSCOPY;  Service: Cardiovascular;  Laterality: N/A;  . SHOULDER SURGERY Right   . TEE WITHOUT CARDIOVERSION N/A 03/07/2017   Procedure: TRANSESOPHAGEAL ECHOCARDIOGRAM (TEE);  Surgeon: Thurmon Fair, MD;  Location: [facility] ENDOSCOPY;  Service: Cardiovascular;  Laterality: N/A;  . TEE WITHOUT CARDIOVERSION  12/10/2017   Procedure: Transesophageal Echocardiogram (Tee);  Surgeon: Marinus Maw, MD;  Location: [facility] INVASIVE CV LAB;  Service: Cardiovascular;;  . TONSILLECTOMY       Family History  Problem Relation Age of Onset  . Emphysema Mother   . Lactose intolerance Father      Social History   Socioeconomic History  . Marital status: Married    Spouse name: Not on file  . Number of children: Not on file  . Years of education: Not on file  . Highest education level: Not on file  Occupational History  . Not on file  Social Needs  . Financial resource strain: Not on file  . Food insecurity:    Worry: Not on file    Inability: Not on file  . Transportation needs:    Medical: Not on file    Non-medical: Not on file  Tobacco Use  . Smoking status: Former Smoker    Last attempt to quit: 09/18/1990    Years since quitting: 27.3  . Smokeless tobacco: Former Neurosurgeon    Types: Snuff    Quit date: 09/18/1990  Substance and Sexual Activity  . Alcohol use: Yes    Comment: avg 4oz alcohol/day  . Drug use: No  . Sexual activity: Not on file  Lifestyle  . Physical activity:    Days per week: Not on file    Minutes per session: Not on file  . Stress: Not on file  Relationships  . Social connections:    Talks  on phone: Not on file    Gets together: Not on file    Attends religious service: Not on file    Active member of club or organization: Not on file    Attends meetings of clubs or organizations: Not on file    Relationship status: Not on file  . Intimate partner violence:    Fear of current or ex partner: Not on file    Emotionally abused: Not on file    Physically abused: Not on file    Forced sexual activity: Not on file  Other Topics Concern  . Not on file  Social History Narrative  . Not on file     BP 122/74   Pulse 70   Ht 5\' 7"  (1.702 m)   Wt 184 lb 9.6 oz (83.7 kg)   SpO2 99%   BMI 28.91 kg/m   Physical Exam:  Well appearing NAD HEENT: Unremarkable Neck:  No JVD,  no thyromegally Lymphatics:  No adenopathy Back:  No CVA tenderness Lungs:  Clear with no wheezes HEART:  Regular rate rhythm, no murmurs, no rubs, no clicks Abd:  soft, positive bowel sounds, no organomegally, no rebound, no guarding Ext:  2 plus pulses, no edema, no cyanosis, no clubbing Skin:  No rashes no nodules Neuro:  CN II through XII intact, motor grossly intact  EKG - nsr   Assess/Plan: 1. Atrial flutter - He has undergone EPS/RFA and is maintaining NSR. He will stop his Eliquis. 2. Syncope - none since his ablation.   Leonia ReevesGregg Taylor,M.D.

## 2018-08-19 ENCOUNTER — Emergency Department (HOSPITAL_COMMUNITY)
Admission: EM | Admit: 2018-08-19 | Discharge: 2018-08-19 | Disposition: A | Discharge status: Home / Self Care | Payer: BC Managed Care – PPO | Attending: Emergency Medicine | Admitting: Emergency Medicine

## 2018-08-19 ENCOUNTER — Emergency Department (HOSPITAL_COMMUNITY): Payer: BC Managed Care – PPO

## 2018-08-19 ENCOUNTER — Encounter (HOSPITAL_COMMUNITY): Payer: Self-pay | Admitting: Emergency Medicine

## 2018-08-19 ENCOUNTER — Other Ambulatory Visit: Payer: Self-pay

## 2018-08-19 DIAGNOSIS — Z79899 Other long term (current) drug therapy: Secondary | ICD-10-CM | POA: Insufficient documentation

## 2018-08-19 DIAGNOSIS — R1030 Lower abdominal pain, unspecified: Secondary | ICD-10-CM

## 2018-08-19 DIAGNOSIS — R1032 Left lower quadrant pain: Secondary | ICD-10-CM | POA: Diagnosis not present

## 2018-08-19 DIAGNOSIS — R1031 Right lower quadrant pain: Secondary | ICD-10-CM | POA: Insufficient documentation

## 2018-08-19 DIAGNOSIS — R339 Retention of urine, unspecified: Secondary | ICD-10-CM | POA: Diagnosis not present

## 2018-08-19 DIAGNOSIS — Z87891 Personal history of nicotine dependence: Secondary | ICD-10-CM | POA: Diagnosis not present

## 2018-08-19 DIAGNOSIS — R110 Nausea: Secondary | ICD-10-CM | POA: Diagnosis not present

## 2018-08-19 LAB — URINALYSIS, ROUTINE W REFLEX MICROSCOPIC
Bilirubin Urine: NEGATIVE
Glucose, UA: NEGATIVE mg/dL
Hgb urine dipstick: NEGATIVE
Ketones, ur: 20 mg/dL — AB
Leukocytes,Ua: NEGATIVE
Nitrite: NEGATIVE
Protein, ur: NEGATIVE mg/dL
Specific Gravity, Urine: 1.023 (ref 1.005–1.030)
pH: 5 (ref 5.0–8.0)

## 2018-08-19 LAB — COMPREHENSIVE METABOLIC PANEL
ALT: 29 U/L (ref 0–44)
AST: 31 U/L (ref 15–41)
Albumin: 4.6 g/dL (ref 3.5–5.0)
Alkaline Phosphatase: 39 U/L (ref 38–126)
Anion gap: 10 (ref 5–15)
BUN: 20 mg/dL (ref 6–20)
CO2: 23 mmol/L (ref 22–32)
Calcium: 9.6 mg/dL (ref 8.9–10.3)
Chloride: 106 mmol/L (ref 98–111)
Creatinine, Ser: 0.97 mg/dL (ref 0.61–1.24)
GFR calc Af Amer: >60 mL/min (ref >60)
GFR calc non Af Amer: >60 mL/min (ref >60)
Glucose, Bld: 127 mg/dL — ABNORMAL HIGH (ref 70–99)
Potassium: 4.6 mmol/L (ref 3.5–5.1)
Sodium: 139 mmol/L (ref 135–145)
Total Bilirubin: 0.8 mg/dL (ref 0.3–1.2)
Total Protein: 7.1 g/dL (ref 6.5–8.1)

## 2018-08-19 LAB — CBC
HCT: 52.4 % — ABNORMAL HIGH (ref 39.0–52.0)
Hemoglobin: 17.8 g/dL — ABNORMAL HIGH (ref 13.0–17.0)
MCH: 33 pg (ref 26.0–34.0)
MCHC: 34 g/dL (ref 30.0–36.0)
MCV: 97 fL (ref 80.0–100.0)
Platelets: 260 10*3/uL (ref 150–400)
RBC: 5.4 MIL/uL (ref 4.22–5.81)
RDW: 13.6 % (ref 11.5–15.5)
WBC: 13.6 10*3/uL — ABNORMAL HIGH (ref 4.0–10.5)
nRBC: 0 % (ref 0.0–0.2)

## 2018-08-19 LAB — LIPASE, BLOOD: Lipase: 34 U/L (ref 11–51)

## 2018-08-19 MED ORDER — ONDANSETRON HCL 4 MG/2ML IJ SOLN
4.0000 mg | Freq: Once | INTRAMUSCULAR | Status: AC
  Administered 2018-08-19: 4 mg via INTRAVENOUS
  Filled 2018-08-19: qty 2

## 2018-08-19 MED ORDER — SODIUM CHLORIDE 0.9 % IV BOLUS
1000.0000 mL | Freq: Once | INTRAVENOUS | Status: AC
  Administered 2018-08-19: 1000 mL via INTRAVENOUS

## 2018-08-19 MED ORDER — MORPHINE SULFATE (PF) 4 MG/ML IV SOLN
8.0000 mg | Freq: Once | INTRAVENOUS | Status: AC
  Administered 2018-08-19: 8 mg via INTRAVENOUS
  Filled 2018-08-19: qty 2

## 2018-08-19 MED ORDER — MORPHINE SULFATE 15 MG PO TABS: 15.0000 mg | ORAL_TABLET | ORAL | 0 refills | Status: DC | PRN

## 2018-08-19 MED ORDER — ONDANSETRON 4 MG PO TBDP: 4.0000 mg | ORAL_TABLET | Freq: Three times a day (TID) | ORAL | 0 refills | Status: DC | PRN

## 2018-08-19 MED ORDER — SODIUM CHLORIDE 0.9% FLUSH
3.0000 mL | Freq: Once | INTRAVENOUS | Status: AC
  Administered 2018-08-19: 3 mL via INTRAVENOUS

## 2018-08-19 NOTE — ED Notes (Signed)
Starting early AM 0200, woke pt from sleep with mid lower abd pain, rated a 10. Pain constant but comes in waves as well without radiation to flanks.  Some nausea without emesis.  Small BM this am which was normal for him.   Alert and oriented without acute guarding.  No changes noted with urination.

## 2018-08-19 NOTE — ED Provider Notes (Signed)
[facility] Provider Note   CSN: [id number] Arrival date & time: 08/19/18  [id number]    History   Chief Complaint Chief Complaint  Patient presents with  . Abdominal Pain    HPI Marvin Simmons is a 58 y.o. male.     58 yo M with a chief complaints of lower abdominal pain.  This comes in waves.  Woke him up from sleep about 2 AM.  Never had pain like this before.  Cause some significant nausea but no vomiting.  No fevers or chills.  He tried to have a bowel movement but was unable this morning.  No prior abdominal surgeries.  Saw his family doctor in the office and they are concerned for appendicitis and sent him here for evaluation.  The history is provided by the patient.  Abdominal Pain Pain location:  RLQ, suprapubic and LLQ Pain quality: cramping, sharp and shooting   Pain radiates to:  Does not radiate Pain severity:  Moderate Onset quality:  Sudden Duration:  7 hours Timing:  Intermittent Progression:  Worsening Chronicity:  New Relieved by:  Nothing Worsened by:  Nothing Ineffective treatments:  None tried Associated symptoms: nausea   Associated symptoms: no chest pain, no chills, no diarrhea, no dysuria, no fever, no shortness of breath and no vomiting     Past Medical History:  Diagnosis Date  . Atrial flutter (HCC) 2017  . Morbid obesity (HCC) 12/31/2016    Patient Active Problem List   Diagnosis Date Noted  . Syncope 12/08/2017  . Slow ventricular response 12/08/2017  . Atrial flutter (HCC) 01/14/2016  . Anticoagulation adequate 01/14/2016    Past Surgical History:  Procedure Laterality Date  . A-FLUTTER ABLATION N/A 12/10/2017   Procedure: A-FLUTTER ABLATION;  Surgeon: Marinus Mawaylor, Gregg W, MD;  [facility];  Service: Cardiovascular;  Laterality: N/A;  . CARDIOVERSION N/A 03/07/2017   Procedure: CARDIOVERSION;  Surgeon: Thurmon Fairroitoru, Mihai, MD;  Location: [facility] ENDOSCOPY;  Service: Cardiovascular;   Laterality: N/A;  . SHOULDER SURGERY Right   . TEE WITHOUT CARDIOVERSION N/A 03/07/2017   Procedure: TRANSESOPHAGEAL ECHOCARDIOGRAM (TEE);  Surgeon: Thurmon Fairroitoru, Mihai, MD;  [facility];  Service: Cardiovascular;  Laterality: N/A;  . TEE WITHOUT CARDIOVERSION  12/10/2017   Procedure: Transesophageal Echocardiogram (Tee);  Surgeon: Marinus Mawaylor, Gregg W, MD;  [facility];  Service: Cardiovascular;;  . TONSILLECTOMY          Home Medications    Prior to Admission medications   Medication Sig Start Date End Date Taking? Authorizing Provider  albuterol (PROAIR HFA) 108 (90 Base) MCG/ACT inhaler Inhale 2 puffs into the lungs every 6 (six) hours as needed for wheezing or shortness of breath.     [provider]  ibuprofen (ADVIL,MOTRIN) 200 MG tablet Take 400 mg by mouth every 6 (six) hours as needed for headache or moderate pain.    [provider]  mometasone (NASONEX) 50 MCG/ACT nasal spray Place 2 sprays into the nose 2 (two) times daily as needed (seasonal allergies).     [provider]  morphine (MSIR) 15 MG tablet Take 1 tablet (15 mg total) by mouth every 4 (four) hours as needed for severe pain. 08/19/18   Melene PlanFloyd, Brenton Joines, DO  ondansetron (ZOFRAN ODT) 4 MG disintegrating tablet Take 1 tablet (4 mg total) by mouth every 8 (eight) hours as needed for nausea or vomiting. 08/19/18   Melene PlanFloyd, Rodriquez Thorner, DO  sildenafil (VIAGRA) 50 MG tablet Take 50  mg by mouth daily as needed for erectile dysfunction.    [provider]  testosterone cypionate (DEPOTESTOSTERONE CYPIONATE) 200 MG/ML injection Inject 200 mg into the muscle every 14 (fourteen) days. 09/16/15   [provider]    Family History Family History  Problem Relation Age of Onset  . Emphysema Mother   . Lactose intolerance Father     Social History Social History   Tobacco Use  . Smoking status: Former Smoker    Quit date: 09/18/1990    Years since quitting: 27.9  . Smokeless  tobacco: Former NeurosurgeonUser    Types: Snuff    Quit date: 09/18/1990  Substance Use Topics  . Alcohol use: Yes    Comment: avg 4oz alcohol/day  . Drug use: No     Allergies   Patient has no known allergies.   Review of Systems Review of Systems  Constitutional: Negative for chills and fever.  HENT: Negative for congestion and facial swelling.   Eyes: Negative for discharge and visual disturbance.  Respiratory: Negative for shortness of breath.   Cardiovascular: Negative for chest pain and palpitations.  Gastrointestinal: Positive for abdominal pain and nausea. Negative for diarrhea and vomiting.  Genitourinary: Positive for flank pain. Negative for difficulty urinating and dysuria.  Musculoskeletal: Negative for arthralgias and myalgias.  Skin: Negative for color change and rash.  Neurological: Negative for tremors, syncope and headaches.  Psychiatric/Behavioral: Negative for confusion and dysphoric mood.     Physical Exam Updated Vital Signs BP 126/79 (BP Location: Right Arm)   Pulse 80   Temp 98 F (36.7 C) (Oral)   Resp 19   Wt 83.7 kg   SpO2 97%   BMI 28.90 kg/m   Physical Exam Vitals signs and nursing note reviewed.  Constitutional:      Appearance: He is well-developed.  HENT:     Head: Normocephalic and atraumatic.  Eyes:     Pupils: Pupils are equal, round, and reactive to light.  Neck:     Musculoskeletal: Normal range of motion and neck supple.     Vascular: No JVD.  Cardiovascular:     Rate and Rhythm: Normal rate and regular rhythm.     Heart sounds: No murmur. No friction rub. No gallop.   Pulmonary:     Effort: No respiratory distress.     Breath sounds: No wheezing.  Abdominal:     General: There is no distension.     Tenderness: There is no abdominal tenderness. There is no guarding or rebound.     Comments: No appreciable abdominal pain on exam  Musculoskeletal: Normal range of motion.  Skin:    Coloration: Skin is not pale.     Findings: No  rash.  Neurological:     Mental Status: He is alert and oriented to person, place, and time.  Psychiatric:        Behavior: Behavior normal.      ED Treatments / Results  Labs (all labs ordered are listed, but only abnormal results are displayed) Labs Reviewed  COMPREHENSIVE METABOLIC PANEL - Abnormal; Notable for the following components:      Result Value   Glucose, Bld 127 (*)    All other components within normal limits  CBC - Abnormal; Notable for the following components:   WBC 13.6 (*)    Hemoglobin 17.8 (*)    HCT 52.4 (*)    All other components within normal limits  URINALYSIS, ROUTINE W REFLEX MICROSCOPIC - Abnormal; Notable for  the following components:   Ketones, ur 20 (*)    All other components within normal limits  LIPASE, BLOOD    EKG None  Radiology Ct Renal Stone Study  Result Date: 08/19/2018 CLINICAL DATA:  Severe upper pelvic pain, inability to urinate, no history of renal stones EXAM: CT ABDOMEN AND PELVIS WITHOUT CONTRAST TECHNIQUE: Multidetector CT imaging of the abdomen and pelvis was performed following the standard protocol without IV contrast. COMPARISON:  None. FINDINGS: Lower chest: No acute abnormality. Hepatobiliary: No solid liver abnormality is seen. No gallstones, gallbladder wall thickening, or biliary dilatation. Pancreas: Unremarkable. No pancreatic ductal dilatation or surrounding inflammatory changes. Spleen: Normal in size without significant abnormality. Adrenals/Urinary Tract: Adrenal glands are unremarkable. There are small, nonobstructive left-sided renal calculi without ureteral calculus or hydronephrosis. The bladder is urine filled although not overtly distended. Stomach/Bowel: Stomach is within normal limits. Appendix appears normal. No evidence of bowel wall thickening, distention, or inflammatory changes. Vascular/Lymphatic: Scattered aortic atherosclerosis. No enlarged abdominal or pelvic lymph nodes. Reproductive: Prostatomegaly.  Other: No abdominal wall hernia or abnormality. No abdominopelvic ascites. Musculoskeletal: No acute or significant osseous findings. IMPRESSION: 1. There are small, nonobstructive left-sided renal calculi without ureteral calculus or hydronephrosis. 2. Prostatomegaly. The bladder is urine filled although not overtly distended. Correlate for urinary retention. Electronically Signed   By: Eddie Candle M.D.   On: 08/19/2018 11:12    Procedures Procedures (including critical care time)  Medications Ordered in ED Medications  sodium chloride flush (NS) 0.9 % injection 3 mL (3 mLs Intravenous Given 08/19/18 0950)  morphine 4 MG/ML injection 8 mg (8 mg Intravenous Given 08/19/18 0949)  ondansetron (ZOFRAN) injection 4 mg (4 mg Intravenous Given 08/19/18 0947)  sodium chloride 0.9 % bolus 1,000 mL (0 mLs Intravenous Stopped 08/19/18 1020)  morphine 4 MG/ML injection 8 mg (8 mg Intravenous Given 08/19/18 1049)  ondansetron (ZOFRAN) injection 4 mg (4 mg Intravenous Given 08/19/18 1050)     Initial Impression / Assessment and Plan / ED Course  I have reviewed the triage vital signs and the nursing notes.  Pertinent labs & imaging results that were available during my care of the patient were reviewed by me and considered in my medical decision making (see chart for details).  Clinical Course as of Aug 19 1406  Wed Aug 19, 2018  1125 The patient's lab work is returned and is largely unremarkable LFTs and lipase are normal he has a mild leukocytosis of 13.6.  He is not anemic.  I have personally reviewed the CT scan images when they returned and I do not see an obvious nephrolithiasis.  The patient's symptoms are much better on reassessment.  Continues to have a benign abdomen.  Awaiting UA.   [DF]    Clinical Course User Index [DF] Deno Etienne, DO       58 yo M with a chief complaints of severe colicky abdominal pain.  History sounds most like a kidney stone.  No history of this in the past.  Will  obtain a CT stone study.  Lab work pain meds nausea medicine and reassess.  The patient continues to feel well.  I was somewhat concerned that the patient had difficulty urinating over the course of his time here.  Patient was able to urinate about 250 cc urine but post void showed about 225 still in his bladder.  He felt like he was able to urinate again and had about 200 output.  At this point will hold off  on Foley placement.  I wonder if this was the cause of his abdominal pain.  Have him follow-up with his family doctor.  2:08 PM:  I have discussed the diagnosis/risks/treatment options with the patient and believe the pt to be eligible for discharge home to follow-up with PCP, urology. We also discussed returning to the ED immediately if new or worsening sx occur. We discussed the sx which are most concerning (e.g., sudden worsening pain, fever, inability to tolerate by mouth) that necessitate immediate return. Medications administered to the patient during their visit and any new prescriptions provided to the patient are listed below.  Medications given during this visit Medications  sodium chloride flush (NS) 0.9 % injection 3 mL (3 mLs Intravenous Given 08/19/18 0950)  morphine 4 MG/ML injection 8 mg (8 mg Intravenous Given 08/19/18 0949)  ondansetron (ZOFRAN) injection 4 mg (4 mg Intravenous Given 08/19/18 0947)  sodium chloride 0.9 % bolus 1,000 mL (0 mLs Intravenous Stopped 08/19/18 1020)  morphine 4 MG/ML injection 8 mg (8 mg Intravenous Given 08/19/18 1049)  ondansetron (ZOFRAN) injection 4 mg (4 mg Intravenous Given 08/19/18 1050)     The patient appears reasonably screen and/or stabilized for discharge and I doubt any other medical condition or other [facility] requiring further screening, evaluation, or treatment in the ED at this time prior to discharge.    Final Clinical Impressions(s) / ED Diagnoses   Final diagnoses:  Lower abdominal pain  Urinary retention    ED Discharge Orders          Ordered    morphine (MSIR) 15 MG tablet  Every 4 hours PRN     08/19/18 1359    ondansetron (ZOFRAN ODT) 4 MG disintegrating tablet  Every 8 hours PRN     08/19/18 1359           Melene PlanFloyd, Allyssia Skluzacek, DO 08/19/18 1408

## 2018-08-19 NOTE — ED Notes (Signed)
ED Provider at bedside. 

## 2018-08-19 NOTE — Discharge Instructions (Addendum)
Return for sudden worsening abdominal pain if it is in one spot or if you are unable to keep anything down or get a fever.

## 2018-08-19 NOTE — ED Notes (Signed)
Pt assisted to ambulate to restroom to attempt to urinate again

## 2018-08-19 NOTE — ED Notes (Signed)
meds given in CT at 1020, patient became pain free and able to move to the CT without difficulty.

## 2018-08-19 NOTE — ED Notes (Signed)
Resting comfortably on his side without needs noted.  Unable to urinate and this time.

## 2018-08-19 NOTE — ED Triage Notes (Signed)
Pt in with generalized sharp abdominal pain since 0200 this am. C/o nausea, no v/d. Pt grimacing, grabbing abdomen in triage. Denies any urinary symptoms. Still has gallbladder and appendix

## 2018-08-19 NOTE — ED Notes (Signed)
Pt verbalized understanding of discharge instructions and denies any further questions at this time.   

## 2018-08-19 NOTE — ED Notes (Signed)
Pt aware of need for urine sample. Urinal at bedside.  

## 2018-09-07 ENCOUNTER — Telehealth: Payer: Self-pay | Admitting: Interventional Cardiology

## 2018-09-07 NOTE — Telephone Encounter (Signed)
New message   Patient would like to know with heart condition if this would put him at higher risk for covid? Please call to discuss.

## 2018-09-07 NOTE — Telephone Encounter (Signed)
LMTCB

## 2018-09-09 NOTE — Telephone Encounter (Signed)
lpmtcb 7/15 

## 2018-09-10 NOTE — Telephone Encounter (Signed)
lpmtcb 7/16

## 2018-09-10 NOTE — Telephone Encounter (Signed)
Patient returning call.

## 2018-09-11 NOTE — Telephone Encounter (Signed)
I spoke to the patient and explained to him that with any heart condition, a patient must take extra precautions with CoVid.  He verbalized understanding.

## 2019-10-03 NOTE — Progress Notes (Signed)
Cardiology Office Note:    Date:  10/04/2019   ID:  KRISTINE TILEY, DOB 12/02/1960, MRN [id number]  PCP:  [facility]  Cardiologist:  Lesleigh Noe, MD   Referring MD: Roe Coombs*   Chief Complaint  Patient presents with  . Atrial Fibrillation    History of Present Illness:    Marvin Simmons is a 59 y.o. male with a hx of morbid obesity, atrial flutter, prior amiodarone/antiocoagulation/ electrical cardioversion, and ultimately s/p RF ablation 2019 followed by discontinuation of anticoagulation.   Since ablation the patient has done well.  He is accompanied by his wife.  They have been at [place].  He has had no recurrent tachycardia since ablation.  Past Medical History:  Diagnosis Date  . Atrial flutter (HCC) 2017  . Morbid obesity (HCC) 12/31/2016    Past Surgical History:  Procedure Laterality Date  . A-FLUTTER ABLATION N/A 12/10/2017   Procedure: A-FLUTTER ABLATION;  Surgeon: Marinus Maw, MD;  Location: [facility] INVASIVE CV LAB;  Service: Cardiovascular;  Laterality: N/A;  . CARDIOVERSION N/A 03/07/2017   Procedure: CARDIOVERSION;  Surgeon: Thurmon Fair, MD;  Location: [facility] ENDOSCOPY;  Service: Cardiovascular;  Laterality: N/A;  . SHOULDER SURGERY Right   . TEE WITHOUT CARDIOVERSION N/A 03/07/2017   Procedure: TRANSESOPHAGEAL ECHOCARDIOGRAM (TEE);  Surgeon: Thurmon Fair, MD;  Location: [facility] ENDOSCOPY;  Service: Cardiovascular;  Laterality: N/A;  . TEE WITHOUT CARDIOVERSION  12/10/2017   Procedure: Transesophageal Echocardiogram (Tee);  Surgeon: Marinus Maw, MD;  Location: [facility] INVASIVE CV LAB;  Service: Cardiovascular;;  . TONSILLECTOMY      Current Medications: Current Meds  Medication Sig  . albuterol (PROAIR HFA) 108 (90 Base) MCG/ACT inhaler Inhale 2 puffs into the lungs every 6 (six) hours as needed for wheezing or shortness of breath.   Marland Kitchen ibuprofen (ADVIL,MOTRIN) 200 MG tablet Take 400 mg by mouth every  6 (six) hours as needed for headache or moderate pain.  . mometasone (NASONEX) 50 MCG/ACT nasal spray Place 2 sprays into the nose 2 (two) times daily as needed (seasonal allergies).   . sildenafil (VIAGRA) 50 MG tablet Take 50 mg by mouth daily as needed for erectile dysfunction.  Marland Kitchen testosterone cypionate (DEPOTESTOSTERONE CYPIONATE) 200 MG/ML injection Inject 200 mg into the muscle every 14 (fourteen) days.     Allergies:   Patient has no known allergies.   Social History   Socioeconomic History  . Marital status: Married    Spouse name: Not on file  . Number of children: Not on file  . Years of education: Not on file  . Highest education level: Not on file  Occupational History  . Not on file  Tobacco Use  . Smoking status: Former Smoker    Quit date: 09/18/1990    Years since quitting: 29.0  . Smokeless tobacco: Former Neurosurgeon    Types: Snuff    Quit date: 09/18/1990  Vaping Use  . Vaping Use: Never used  Substance and Sexual Activity  . Alcohol use: Yes    Comment: avg 4oz alcohol/day  . Drug use: No  . Sexual activity: Not on file  Other Topics Concern  . Not on file  Social History Narrative  . Not on file   Social Determinants of Health   Financial Resource Strain:   . Difficulty of Paying Living Expenses:   Food Insecurity:   . Worried About Programme researcher, broadcasting/film/video in the Last Year:   . [employer]  of Food in the Last Year:   Transportation Needs:   . Freight forwarder (Medical):   Marland Kitchen Lack of Transportation (Non-Medical):   Physical Activity:   . Days of Exercise per Week:   . Minutes of Exercise per Session:   Stress:   . Feeling of Stress :   Social Connections:   . Frequency of Communication with Friends and Family:   . Frequency of Social Gatherings with Friends and Family:   . Attends Religious Services:   . Active Member of Clubs or Organizations:   . Attends Banker Meetings:   Marland Kitchen Marital Status:      Family History: The patient's  family history includes Emphysema in his mother; Lactose intolerance in his father.  ROS:   Please see the history of present illness.    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  All other systems reviewed and are negative.  EKGs/Labs/Other Studies Reviewed:    The following studies were reviewed today: No new imaging data  EKG:  EKG performed today demonstrates normal sinus rhythm, right atrial abnormality, and heart rate of 76 bpm.  Recent Labs: No results found for requested labs within last 8760 hours.  Recent Lipid Panel    Component Value Date/Time   CHOL 161 12/09/2017 0325   TRIG 70 12/09/2017 0325   HDL 37 (L) 12/09/2017 0325   CHOLHDL 4.4 12/09/2017 0325   VLDL 14 12/09/2017 0325   LDLCALC 110 (H) 12/09/2017 0325    Physical Exam:    VS:  BP 126/82   Pulse 76   Ht 5\' 7"  (1.702 m)   Wt 207 lb 3.2 oz (94 kg)   SpO2 98%   BMI 32.45 kg/m     Wt Readings from Last 3 Encounters:  10/04/19 207 lb 3.2 oz (94 kg)  08/19/18 184 lb 8.4 oz (83.7 kg)  01/15/18 184 lb 9.6 oz (83.7 kg)     GEN: Overweight. No acute distress HEENT: Normal NECK: No JVD. LYMPHATICS: No lymphadenopathy CARDIAC:  RRR without murmur, gallop, or edema. VASCULAR:  Normal Pulses. No bruits. RESPIRATORY:  Clear to auscultation without rales, wheezing or rhonchi  ABDOMEN: Soft, non-tender, non-distended, No pulsatile mass, MUSCULOSKELETAL: No deformity  SKIN: Warm and dry NEUROLOGIC:  Alert and oriented x 3 PSYCHIATRIC:  Normal affect   ASSESSMENT:    1. Typical atrial flutter (HCC)   2. Anticoagulation adequate   3. On amiodarone therapy   4. Educated about COVID-19 virus infection    PLAN:    In order of problems listed above:  1. No recurrence of atrial flutter since ablation. 2. Anticoagulation has been discontinued. 3. Amiodarone discontinued. 4. Has received M RNA vaccination.  Plan clinical follow-up in 1  year.   Medication Adjustments/Labs and Tests Ordered: Current medicines are reviewed at length with the patient today.  Concerns regarding medicines are outlined above.  Orders Placed This Encounter  Procedures  . EKG 12-Lead   No orders of the defined types were placed in this encounter.   Patient Instructions  Medication Instructions:  Your physician recommends that you continue on your current medications as directed. Please refer to the Current Medication list given to you today.  *If you need a refill on your cardiac medications before your next appointment, please call your pharmacy*   Lab Work: None If you have labs (blood work) drawn today and your tests are completely normal, you  will receive your results only by: Marland Kitchen MyChart Message (if you have MyChart) OR . A paper copy in the mail If you have any lab test that is abnormal or we need to change your treatment, we will call you to review the results.   Testing/Procedures: None   Follow-Up: At [facility], you and your health needs are our priority.  As part of our continuing mission to provide you with exceptional heart care, we have created designated Provider Care Teams.  These Care Teams include your primary Cardiologist (physician) and Advanced Practice Providers (APPs -  Physician Assistants and Nurse Practitioners) who all work together to provide you with the care you need, when you need it.  We recommend signing up for the patient portal called "MyChart".  Sign up information is provided on this After Visit Summary.  MyChart is used to connect with patients for Virtual Visits (Telemedicine).  Patients are able to view lab/test results, encounter notes, upcoming appointments, etc.  Non-urgent messages can be sent to your provider as well.   To learn more about what you can do with MyChart, go to [email or web address].    Your next appointment:   12 month(s)  The format for your next appointment:   In  Person  Provider:   You may see Lesleigh Noe, MD or one of the following Advanced Practice Providers on your designated Care Team:    Norma Fredrickson, NP  Nada Boozer, NP  Georgie Chard, NP    Other Instructions      Signed, Lesleigh Noe, MD  10/04/2019 4:15 PM    [facility]

## 2019-10-04 ENCOUNTER — Other Ambulatory Visit: Payer: Self-pay

## 2019-10-04 ENCOUNTER — Ambulatory Visit: Payer: BC Managed Care – PPO | Admitting: Interventional Cardiology

## 2019-10-04 ENCOUNTER — Encounter: Payer: Self-pay | Admitting: Interventional Cardiology

## 2019-10-04 VITALS — BP 126/82 | HR 76 | Ht 67.0 in | Wt 207.2 lb

## 2019-10-04 DIAGNOSIS — I483 Typical atrial flutter: Secondary | ICD-10-CM

## 2019-10-04 DIAGNOSIS — Z7189 Other specified counseling: Secondary | ICD-10-CM

## 2019-10-04 DIAGNOSIS — Z7901 Long term (current) use of anticoagulants: Secondary | ICD-10-CM | POA: Diagnosis not present

## 2019-10-04 DIAGNOSIS — Z79899 Other long term (current) drug therapy: Secondary | ICD-10-CM | POA: Diagnosis not present

## 2019-10-04 NOTE — Patient Instructions (Signed)

## 2019-10-27 IMAGING — DX DG CHEST 1V PORT
1 series · 1 of 1 positions shown · non-contrast
Comparison: Prior radiograph 09/18/2015.

CLINICAL DATA: Initial evaluation for acute chest pain.

EXAM:
PORTABLE CHEST 1 VIEW

[chest ap]
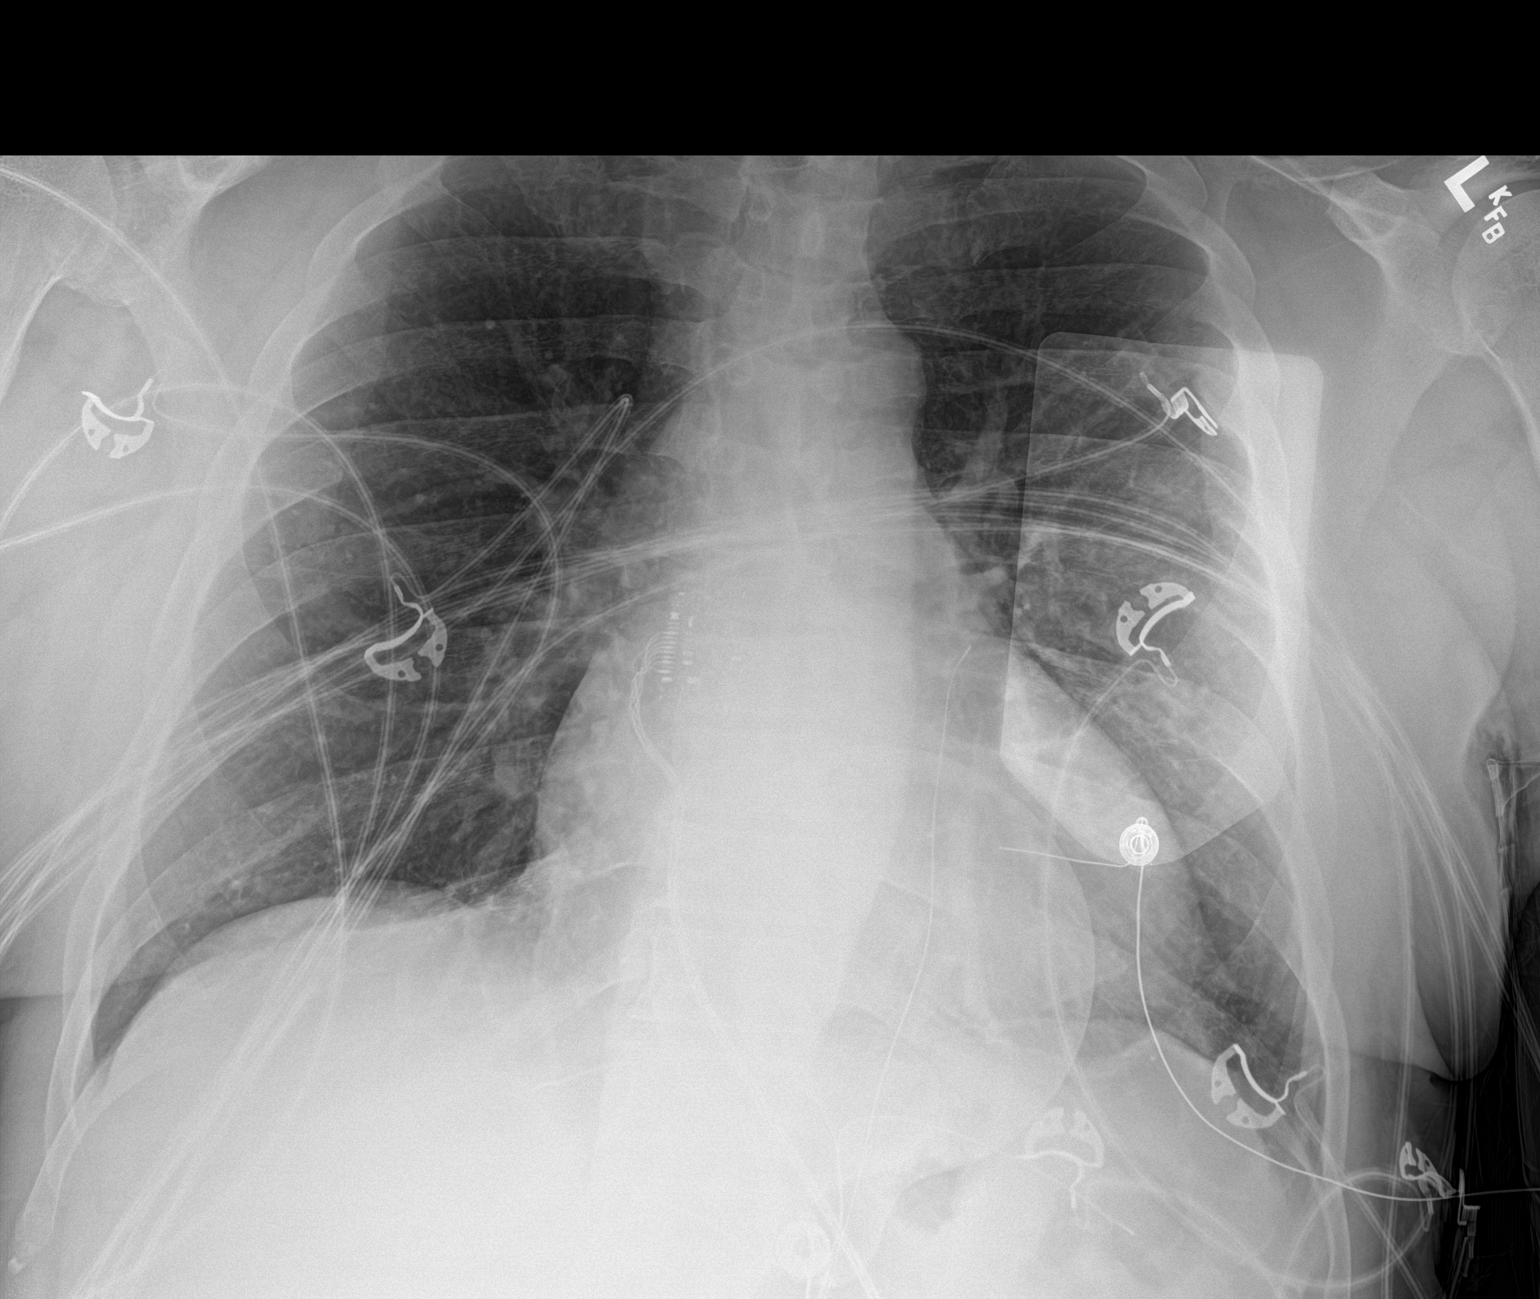

[1 of 1 positions shown; findings below may reference images not displayed]

FINDINGS: Extent to a shin of the cardiac silhouette related AP technique.
Defibrillator pads overlie the left and lower chest. Mediastinal
silhouette within normal limits.

Lungs normally inflated. No focal infiltrates. No pulmonary edema or
pleural effusion. No pneumothorax.

No acute osseus abnormality.
IMPRESSION: No active cardiopulmonary disease.

## 2020-05-19 ENCOUNTER — Telehealth: Payer: Self-pay | Admitting: *Deleted

## 2020-05-19 NOTE — Telephone Encounter (Signed)
   [facility] Pre-operative Risk Assessment    HEARTCARE STAFF: - Please ensure there is not already an duplicate clearance open for this procedure. - Under Visit Info/Reason for Call, type in Other and utilize the format Clearance MM/DD/YY or Clearance TBD. Do not use dashes or single digits. - If request is for dental extraction, please clarify the # of teeth to be extracted.  Request for surgical clearance:  1. What type of surgery is being performed?  LEFT KNEE ARTHROSCOPY   2. When is this surgery scheduled?  TBD   3. What type of clearance is required (medical clearance vs. Pharmacy clearance to hold med vs. Both)?  MEDICAL  4. Are there any medications that need to be held prior to surgery and how long? N/A   5. Practice name and name of physician performing surgery?  [facility] / DR. Tonita Cong   6. What is the office phone number?  [phone]   7.   What is the office fax number?  [phone]  8.   Anesthesia type (None, local, MAC, general) ?  GENERAL ANESTHESIA    Jeanann Lewandowsky 05/19/2020, 3:41 PM  _________________________________________________________________   (provider comments below)  '

## 2020-05-23 NOTE — Telephone Encounter (Signed)
Follow up:     Patient returning call back from this morning patient states.

## 2020-05-23 NOTE — Telephone Encounter (Signed)
Returned call back with no answer

## 2020-05-24 NOTE — Telephone Encounter (Signed)
Follow Up:      Pt is returning a call from yesterday. 

## 2020-05-24 NOTE — Telephone Encounter (Signed)
I s/w the pt to set up a pre op appt for clearance. I offered a number of appt's to the pt without having to delay his surgery too far out. 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 Pt states on Spring Break he will be out of town. After a number of appt's offered pt has been scheduled to see Tereso Newcomer, [facility] 07/25/20 @ 3:15. Pt states that is exam week and that he may need to change the appt if he is called in, though pt will let us know. Pt asked if he could also be put on a wait list incase someone cancels. I will send FYI to surgeon's office.

## 2020-05-24 NOTE — Telephone Encounter (Signed)
Primary Cardiologist:Henry Malissa Hippo III, MD  Chart reviewed as part of pre-operative protocol coverage. Because of Marvin Simmons's past medical history and time since last visit, he/she will require a follow-up visit in order to better assess preoperative cardiovascular risk.  Pre-op covering staff: - Please schedule appointment and call patient to inform them. - Please contact requesting surgeon's office via preferred method (i.e, phone, fax) to inform them of need for appointment prior to surgery.  If applicable, this message will also be routed to pharmacy pool and/or primary cardiologist for input on holding anticoagulant/antiplatelet agent as requested below so that this information is available at time of patient's appointment.   Ronney Asters, NP  05/24/2020, 8:41 AM

## 2020-05-26 ENCOUNTER — Ambulatory Visit: Payer: BC Managed Care – PPO | Admitting: Interventional Cardiology

## 2020-06-20 ENCOUNTER — Ambulatory Visit: Payer: BC Managed Care – PPO | Admitting: Physician Assistant

## 2020-06-20 ENCOUNTER — Encounter: Payer: Self-pay | Admitting: Physician Assistant

## 2020-06-20 ENCOUNTER — Other Ambulatory Visit: Payer: Self-pay

## 2020-06-20 VITALS — BP 140/70 | HR 80 | Ht 67.0 in | Wt 216.2 lb

## 2020-06-20 DIAGNOSIS — I483 Typical atrial flutter: Secondary | ICD-10-CM

## 2020-06-20 DIAGNOSIS — Z01810 Encounter for preprocedural cardiovascular examination: Secondary | ICD-10-CM | POA: Diagnosis not present

## 2020-06-20 DIAGNOSIS — R030 Elevated blood-pressure reading, without diagnosis of hypertension: Secondary | ICD-10-CM | POA: Diagnosis not present

## 2020-06-20 DIAGNOSIS — Z0000 Encounter for general adult medical examination without abnormal findings: Secondary | ICD-10-CM | POA: Diagnosis not present

## 2020-06-20 NOTE — Patient Instructions (Signed)
Medication Instructions:  Your physician recommends that you continue on your current medications as directed. Please refer to the Current Medication list given to you today.  *If you need a refill on your cardiac medications before your next appointment, please call your pharmacy*   Lab Work: -None  If you have labs (blood work) drawn today and your tests are completely normal, you will receive your results only by: Marland Kitchen MyChart Message (if you have MyChart) OR . A paper copy in the mail If you have any lab test that is abnormal or we need to change your treatment, we will call you to review the results.   Testing/Procedures: -None   Follow-Up: At [facility], you and your health needs are our priority.  As part of our continuing mission to provide you with exceptional heart care, we have created designated Provider Care Teams.  These Care Teams include your primary Cardiologist (physician) and Advanced Practice Providers (APPs -  Physician Assistants and Nurse Practitioners) who all work together to provide you with the care you need, when you need it.  We recommend signing up for the patient portal called "MyChart".  Sign up information is provided on this After Visit Summary.  MyChart is used to connect with patients for Virtual Visits (Telemedicine).  Patients are able to view lab/test results, encounter notes, upcoming appointments, etc.  Non-urgent messages can be sent to your provider as well.   To learn more about what you can do with MyChart, go to [email or web address].    Your next appointment:   6 month(s)  The format for your next appointment:   In Person  Provider:   Verdis Prime, MD   Other Instructions Your physician wants you to follow-up in: 6 months with Dr.Smith.  You will receive a reminder letter in the mail two months in advance. If you don't receive a letter, please call our office to schedule the follow-up appointment.

## 2020-06-20 NOTE — Progress Notes (Signed)
Cardiology Office Note:    Date:  06/20/2020   ID:  Marvin Simmons, DOB November 05, 1960, MRN [id number]  PCP:  [facility]  Cardiologist:  Lesleigh Noe, MD   Electrophysiologist:  None       Referring MD: Roe Coombs*   Chief Complaint:  Surgical Clearance    Patient Profile:    Marvin Simmons is a 60 y.o. male with:   Atrial flutter  Hx of Amiodarone Rx >> DC'd after RFA  S/p DCCV in 1/19  S/p RFA in 10/19 >> anticoagulation DC'd   Prior CV studies: TEE 12/10/2017 EF 50, trivial MR, dilated RA  Echocardiogram 10/02/2015 Mild focal basal septal hypertrophy, EF 55-60, normal wall motion, normal RVSF, PASP 26  History of Present Illness: Marvin Simmons was last seen by Dr. Katrinka Blazing in 8/21.  He returns for surgical clearance.  He needs to undergo left knee arthroscopy with Dr. Shelle Iron.  He is here today with his wife.  He was very active (running, etc) up until he started having knee pain.  He has a meniscal tear.  He has been able to continue walking but has a lot of pain associated with this.  He is able to do all activities around his house without limitations.   He has not had chest pain, shortness of breath, syncope.        Past Medical History:  Diagnosis Date  . Atrial flutter (HCC) 2017  . Morbid obesity (HCC) 12/31/2016    Current Medications: Current Meds  Medication Sig  . albuterol (VENTOLIN HFA) 108 (90 Base) MCG/ACT inhaler Inhale 2 puffs into the lungs every 6 (six) hours as needed for wheezing or shortness of breath.   . mometasone (NASONEX) 50 MCG/ACT nasal spray Place 2 sprays into the nose 2 (two) times daily as needed (seasonal allergies).   . sildenafil (VIAGRA) 50 MG tablet Take 50 mg by mouth daily as needed for erectile dysfunction.  Marland Kitchen testosterone cypionate (DEPOTESTOSTERONE CYPIONATE) 200 MG/ML injection Inject 200 mg into the muscle every 14 (fourteen) days.      Allergies:   Patient has no known allergies.   Social History   Tobacco Use  . Smoking status: Former Smoker    Quit date: 09/18/1990    Years since quitting: 29.7  . Smokeless tobacco: Former Neurosurgeon    Types: Snuff    Quit date: 09/18/1990  Vaping Use  . Vaping Use: Never used  Substance Use Topics  . Alcohol use: Yes    Comment: avg 4oz alcohol/day  . Drug use: No     Family Hx: The patient's family history includes Emphysema in his mother; Lactose intolerance in his father.  ROS   EKGs/Labs/Other Test Reviewed:    EKG:  EKG is  ordered today.  The ekg ordered today demonstrates normal sinus rhythm, HR 80, normal axis, no ST-TW changes, QTc 424 ms  Recent Labs: No results found for requested labs within last 8760 hours.   Recent Lipid Panel Lab Results  Component Value Date/Time   CHOL 161 12/09/2017 03:25 AM   TRIG 70 12/09/2017 03:25 AM   HDL 37 (L) 12/09/2017 03:25 AM   CHOLHDL 4.4 12/09/2017 03:25 AM   LDLCALC 110 (H) 12/09/2017 03:25 AM      Risk Assessment/Calculations:    CHA2DS2-VASc Score = 0  This indicates a 0.2% annual risk of stroke. The patient's score is based upon: CHF History: No  HTN History: No Diabetes History: No Stroke History: No Vascular Disease History: No Age Score: 0 Gender Score: 0      Physical Exam:    VS:  BP 140/70   Pulse 80   Ht 5\' 7"  (1.702 m)   Wt 216 lb 3.2 oz (98.1 kg)   SpO2 97%   BMI 33.86 kg/m     Wt Readings from Last 3 Encounters:  06/20/20 216 lb 3.2 oz (98.1 kg)  10/04/19 207 lb 3.2 oz (94 kg)  08/19/18 184 lb 8.4 oz (83.7 kg)     Constitutional:      Appearance: Healthy appearance. Not in distress.  Neck:     Vascular: No carotid bruit. JVD normal.  Pulmonary:     Effort: Pulmonary effort is normal.     Breath sounds: No wheezing. No rales.  Cardiovascular:     Normal rate. Regular rhythm. Normal S1. Normal S2.     Murmurs: There is no murmur.  Edema:    Peripheral edema absent.   Abdominal:     Palpations: Abdomen is soft. There is no hepatomegaly.  Skin:    General: Skin is warm and dry.  Neurological:     General: No focal deficit present.     Mental Status: Alert and oriented to person, place and time.     Cranial Nerves: Cranial nerves are intact.        ASSESSMENT & PLAN:    1. Preoperative cardiovascular examination   Marvin Simmons's perioperative risk of a major cardiac event is 0.4% according to the Revised Cardiac Risk Index (RCRI).  Therefore, he is at low risk for perioperative complications.   His functional capacity is good at 5.62 METs according to the Duke Activity Status Index (DASI). Recommendations: According to ACC/AHA guidelines, no further cardiovascular testing needed.  The patient may proceed to surgery at acceptable risk.    2. Typical atrial flutter (HCC) Maintaining normal sinus rhythm.  He underwent RF ablation in 2019.  He is no longer on anticoagulation due to low stroke risk.    3. Elevated blood pressure reading He has stopped exercising b/c of his knee and has gained some weight.  BP is usually well controlled.  I have asked him to monitor this especially after he starts exercising again.  If BP remains > 130/80, he will need med rx.   4. Healthcare maintenance He does not really f/u with primary care on a regular basis.  Once he gets back on track with exercising and loses weight, it would be good to get a lipid panel.        Dispo:  Return in about 6 months (around 12/20/2020) for Routine follow up in 6 months with Dr. 12/22/2020..   Medication Adjustments/Labs and Tests Ordered: Current medicines are reviewed at length with the patient today.  Concerns regarding medicines are outlined above.  Tests Ordered: Orders Placed This Encounter  Procedures  . EKG 12-Lead   Medication Changes: No orders of the defined types were placed in this encounter.   Signed, Katrinka Blazing, PA-C  06/20/2020 2:49 PM    [facility]  [facility] [address] [place], [place], [place]  [place] Phone: [phone]; Fax: [phone]

## 2020-06-20 NOTE — Telephone Encounter (Signed)
Note faxed to surgeon's office today. Tereso Newcomer, PA-C    06/20/2020 4:57 PM

## 2020-07-07 IMAGING — CT CT RENAL STONE PROTOCOL
2 of 4 series · 17 of 46 positions shown, 19 images · non-contrast
Comparison: None.

CLINICAL DATA: Severe upper pelvic pain, inability to urinate, no
history of renal stones

EXAM:
CT ABDOMEN AND PELVIS WITHOUT CONTRAST
TECHNIQUE: Multidetector CT imaging of the abdomen and pelvis was performed
following the standard protocol without IV contrast.

[Series 3: stone study 5.0 i30f 2 · axial · 0.73mm/px · z∈[+704,+1128]mm · 14 of 93 slices shown, 16 images]
[im 4/93  soft-tissue]
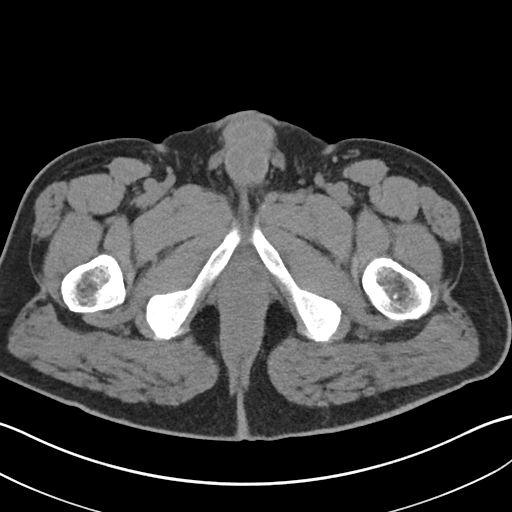
[im 4/93  bone]
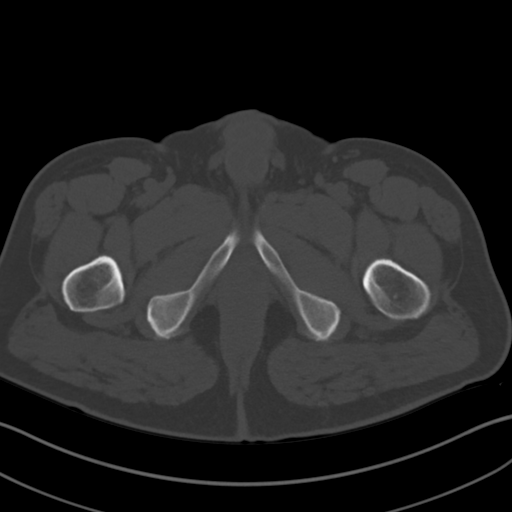
[im 12/93  soft-tissue]
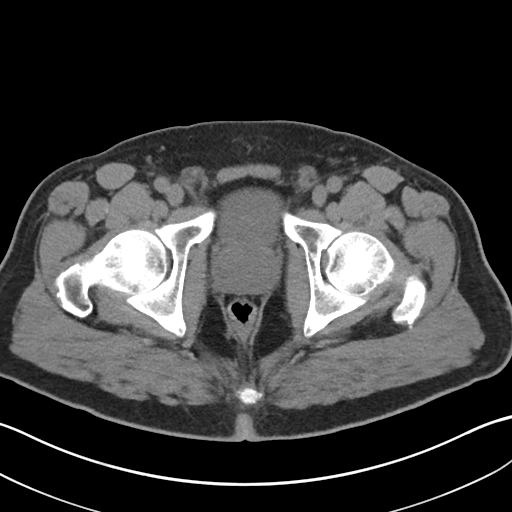
[im 20/93  soft-tissue]
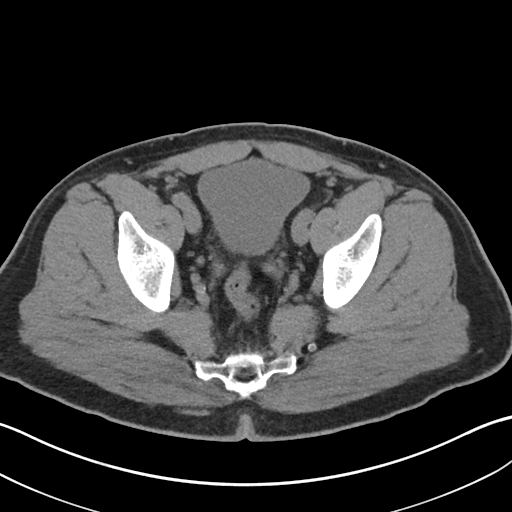
[im 24/93  soft-tissue]
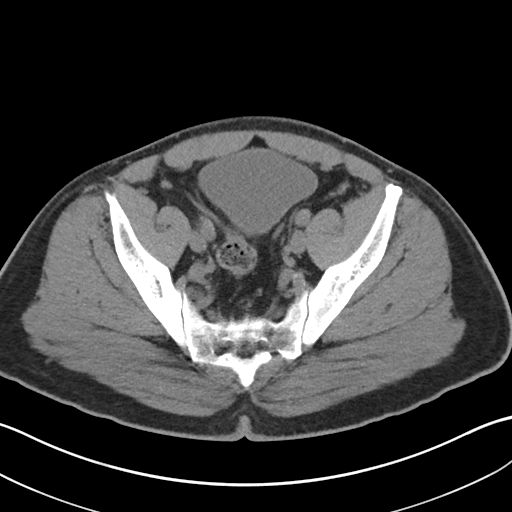
[im 31/93  soft-tissue]
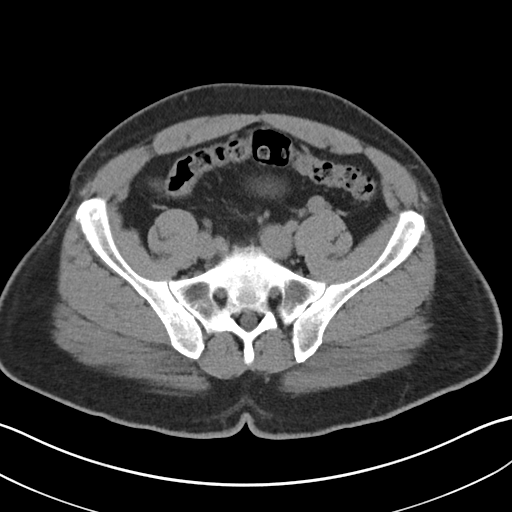
[im 39/93  soft-tissue]
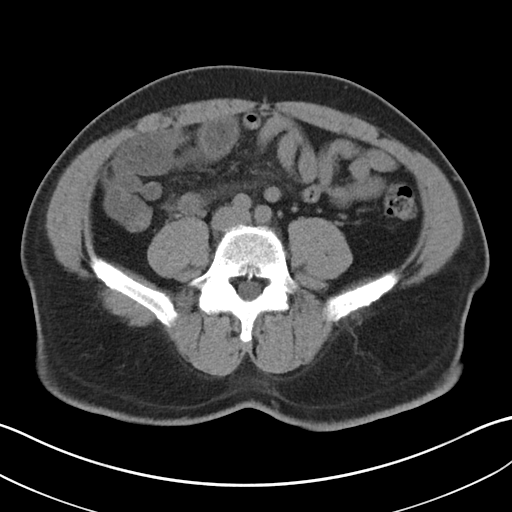
[im 43/93  soft-tissue]
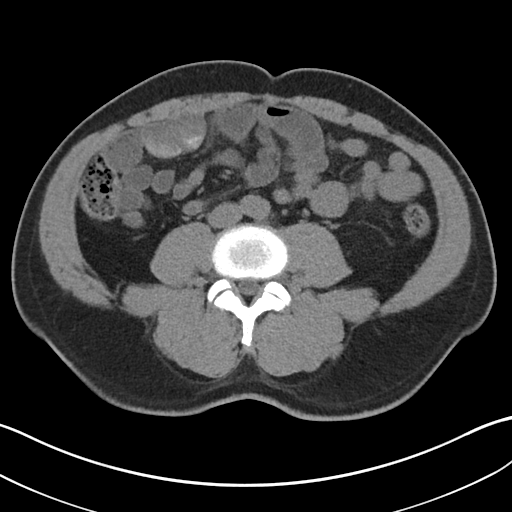
[im 50/93  soft-tissue]
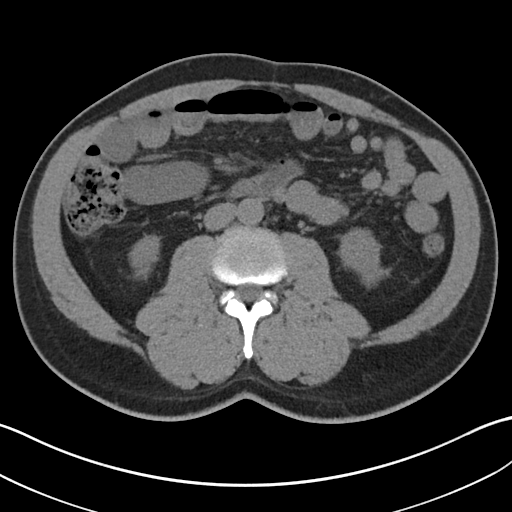
[im 54/93  soft-tissue]
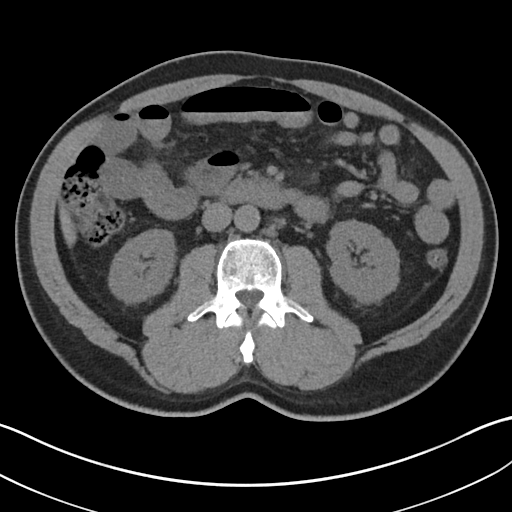
[im 54/93  bone]
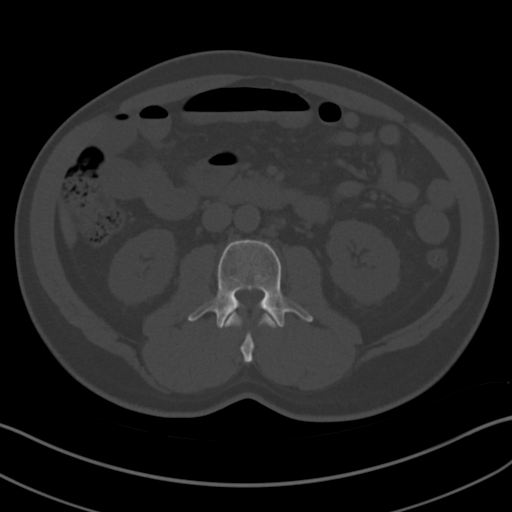
[im 62/93  soft-tissue]
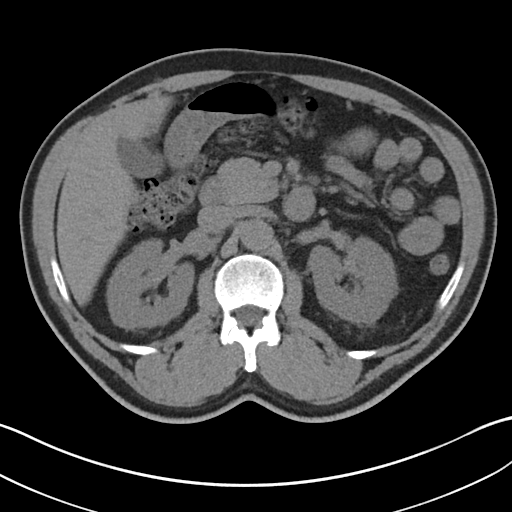
[im 70/93  soft-tissue]
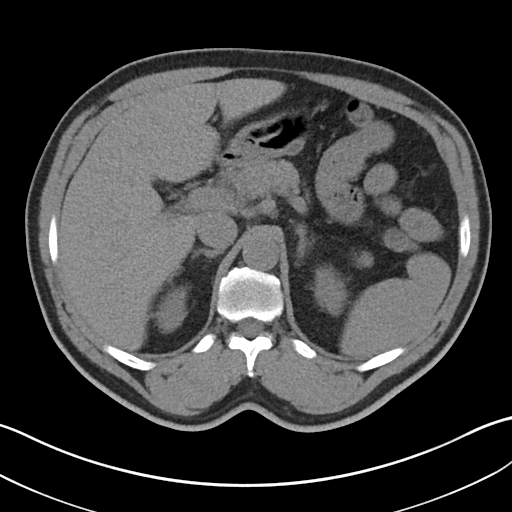
[im 73/93  soft-tissue]
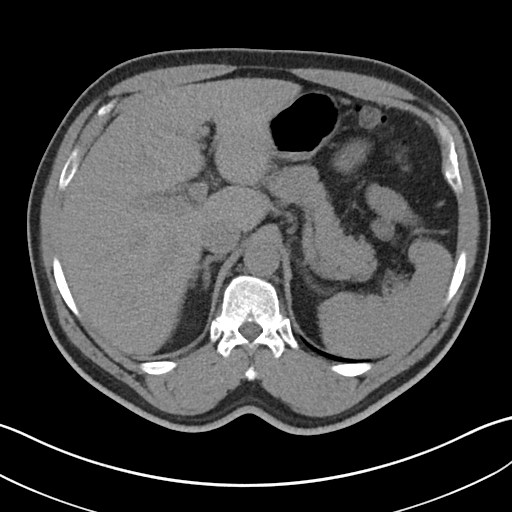
[im 81/93  soft-tissue]
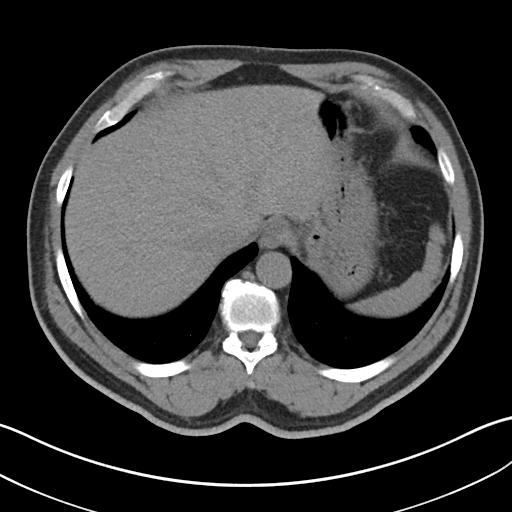
[im 89/93  soft-tissue]
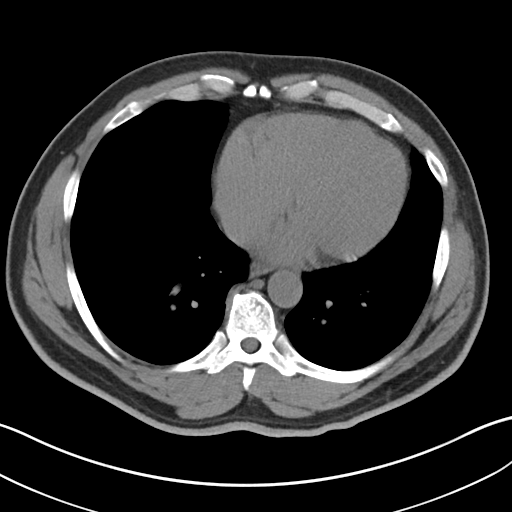

[Series 6: coronal soft tissue · coronal · 0.84mm/px · 3 of 121 slices shown]
[im 41/121  soft-tissue]
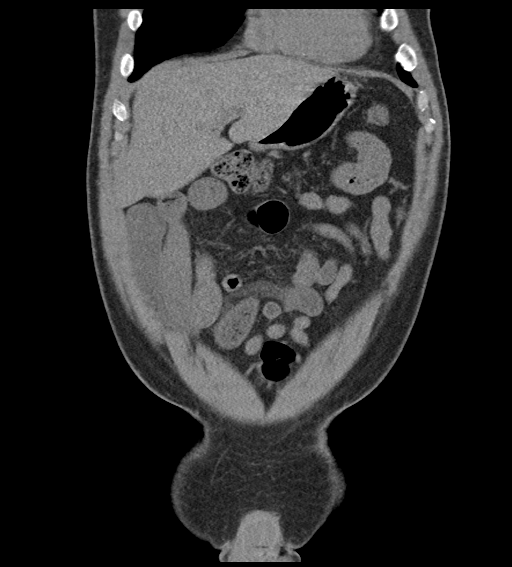
[im 54/121  soft-tissue]
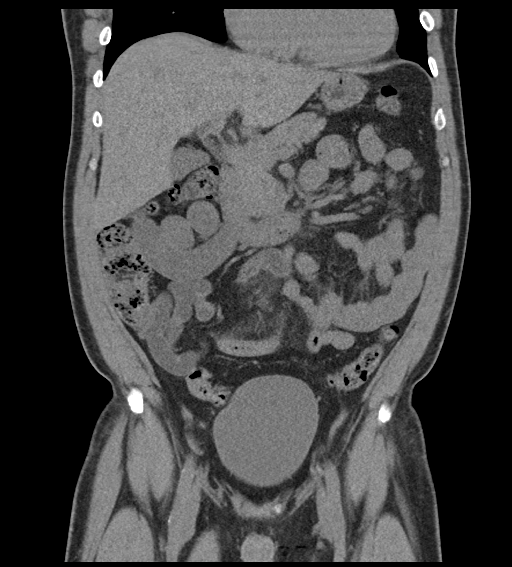
[im 67/121  soft-tissue]
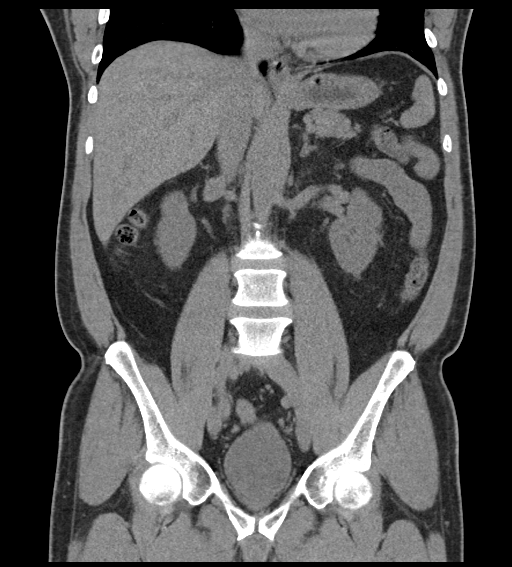

[17 of 46 positions shown; findings below may reference images not displayed]

FINDINGS: Lower chest: No acute abnormality.

Hepatobiliary: No solid liver abnormality is seen. No gallstones,
gallbladder wall thickening, or biliary dilatation.

Pancreas: Unremarkable. No pancreatic ductal dilatation or
surrounding inflammatory changes.

Spleen: Normal in size without significant abnormality.

Adrenals/Urinary Tract: Adrenal glands are unremarkable. There are
small, nonobstructive left-sided renal calculi without ureteral
calculus or hydronephrosis. The bladder is urine filled although not
overtly distended.

Stomach/Bowel: Stomach is within normal limits. Appendix appears
normal. No evidence of bowel wall thickening, distention, or
inflammatory changes.

Vascular/Lymphatic: Scattered aortic atherosclerosis. No enlarged
abdominal or pelvic lymph nodes.

Reproductive: Prostatomegaly.

Other: No abdominal wall hernia or abnormality. No abdominopelvic
ascites.

Musculoskeletal: No acute or significant osseous findings.
IMPRESSION: 1. There are small, nonobstructive left-sided renal calculi without
ureteral calculus or hydronephrosis.

2. Prostatomegaly. The bladder is urine filled although not overtly
distended. Correlate for urinary retention.

## 2020-07-25 ENCOUNTER — Ambulatory Visit: Payer: BC Managed Care – PPO | Admitting: Physician Assistant

## 2023-09-26 ENCOUNTER — Encounter (HOSPITAL_BASED_OUTPATIENT_CLINIC_OR_DEPARTMENT_OTHER): Payer: Self-pay

## 2023-09-29 ENCOUNTER — Ambulatory Visit (INDEPENDENT_AMBULATORY_CARE_PROVIDER_SITE_OTHER): Payer: Self-pay | Admitting: Cardiology

## 2023-09-29 VITALS — BP 148/98 | HR 83 | Ht 67.0 in | Wt 228.4 lb

## 2023-09-29 DIAGNOSIS — I4892 Unspecified atrial flutter: Secondary | ICD-10-CM

## 2023-09-29 DIAGNOSIS — R030 Elevated blood-pressure reading, without diagnosis of hypertension: Secondary | ICD-10-CM

## 2023-09-29 NOTE — Progress Notes (Signed)
 Cardiology Office Note:  .   Date:  09/29/2023  ID:  Marvin Simmons, DOB Oct 18, 1960, MRN [id number] PCP: [facility]  [facility] Providers Cardiologist:  Victory LELON Claudene DOUGLAS, MD (Inactive)     History of Present Illness: .   Marvin Simmons is a 63 y.o. male Discussed the use of AI scribe software for clinical note transcription with the patient, who gave verbal consent to proceed.  History of Present Illness Marvin Simmons is a 63 year old male with atrial flutter who presents for a cardiovascular follow-up.  Atrial flutter status - History of atrial flutter treated with cardioversion and radiofrequency ablation in 2019 - Amiodarone  and anticoagulation discontinued post-procedure - No recurrence of atrial flutter symptoms since ablation - No palpitations, chest pain, or shortness of breath - No fainting episodes  Weight fluctuation and alcohol use - Significant weight fluctuation with prior weight loss to 185 pounds, now increased to 230 pounds - Attributes weight gain to increased alcohol consumption and decreased physical activity - Acknowledges excessive alcohol intake - Does not smoke  Knee pain and functional limitation - History of torn meniscus requiring surgery - Prolonged recovery over nearly one year, resulting in inability to exercise - Difficulty with physical activities such as cycling due to knee pain - Diagnosed with knee arthritis - Received knee injections, which are no longer effective - Considering knee replacement surgery - Awaiting a new knee brace to facilitate exercise      ROS: No CP  Studies Reviewed: SABRA   EKG Interpretation Date/Time:  Monday September 29 2023 10:55:58 EDT Ventricular Rate:  83 PR Interval:  146 QRS Duration:  82 QT Interval:  344 QTC Calculation: 404 R Axis:   -18  Text Interpretation: Normal sinus rhythm Normal ECG When compared with ECG of 11-Dec-2017 07:03, No significant change  was found Confirmed by Jeffrie Anes ([id number]) on 09/29/2023 11:15:25 AM    Results DIAGNOSTIC EKG: Normal sinus rhythm, 83 bpm (09/29/2023) Risk Assessment/Calculations:          Physical Exam:   VS:  BP (!) 148/98 (BP Location: Left Arm, Patient Position: Sitting, Cuff Size: Large)   Pulse 83   Ht 5' 7 (1.702 m)   Wt 228 lb 6.4 oz (103.6 kg)   SpO2 96%   BMI 35.77 kg/m    Wt Readings from Last 3 Encounters:  09/29/23 228 lb 6.4 oz (103.6 kg)  06/20/20 216 lb 3.2 oz (98.1 kg)  10/04/19 207 lb 3.2 oz (94 kg)    GEN: Well nourished, well developed in no acute distress NECK: No JVD; No carotid bruits CARDIAC: RRR, no murmurs, no rubs, no gallops RESPIRATORY:  Clear to auscultation without rales, wheezing or rhonchi  ABDOMEN: Soft, non-tender, non-distended EXTREMITIES:  No edema; No deformity   ASSESSMENT AND PLAN: .    Assessment and Plan Assessment & Plan History of atrial flutter, status post ablation and cardioversion Atrial flutter treated with cardioversion and radiofrequency ablation in 2019. Currently, no recurrence of symptoms. EKG shows normal sinus rhythm with a heart rate of 83 bpm. No chest pain, significant dyspnea, or syncope reported. Echocardiogram in 2019 showed an ejection fraction of 55-60%. - Follow up if new symptoms arise.  Obesity Weight gain to 230 pounds from 185 pounds, attributed to decreased physical activity due to knee injury and increased alcohol consumption. Acknowledges need for weight loss to improve overall health, including knee pain and blood pressure. Previously successful weight loss  through a medically supervised program. Plans to resume exercise, including stationary biking and walking. - Resume exercise regimen focusing on low-impact activities like stationary biking and walking. - Consider revisiting the medically supervised weight loss program. - Monitor weight and adjust diet to support weight loss goals.  Alcohol use Excessive  alcohol consumption contributing to weight gain. Recognizes need to reduce alcohol intake to aid in weight loss and improve health. Discussed caloric impact of alcohol and importance of reducing intake as part of a weight loss strategy. - Reduce alcohol consumption to decrease caloric intake. - Incorporate strategies to manage alcohol intake as part of the weight loss plan.         Dispo: As needed.   Signed, Oneil Parchment, MD

## 2023-09-29 NOTE — Patient Instructions (Signed)
 Medication Instructions:  The current medical regimen is effective;  continue present plan and medications.  *If you need a refill on your cardiac medications before your next appointment, please call your pharmacy*  Follow-Up: At [facility], you and your health needs are our priority.  As part of our continuing mission to provide you with exceptional heart care, our providers are all part of one team.  This team includes your primary Cardiologist (physician) and Advanced Practice Providers or APPs (Physician Assistants and Nurse Practitioners) who all work together to provide you with the care you need, when you need it.  Follow up as needed with Dr Jeffrie.  We recommend signing up for the patient portal called MyChart.  Sign up information is provided on this After Visit Summary.  MyChart is used to connect with patients for Virtual Visits (Telemedicine).  Patients are able to view lab/test results, encounter notes, upcoming appointments, etc.  Non-urgent messages can be sent to your provider as well.   To learn more about what you can do with MyChart, go to [email or web address].

## 2023-12-12 ENCOUNTER — Other Ambulatory Visit (HOSPITAL_BASED_OUTPATIENT_CLINIC_OR_DEPARTMENT_OTHER): Payer: Self-pay

## 2023-12-12 MED ORDER — COMIRNATY 30 MCG/0.3ML IM SUSY
0.3000 mL | PREFILLED_SYRINGE | Freq: Once | INTRAMUSCULAR | 0 refills | Status: AC
  Filled 2023-12-12: qty 0.3, 1d supply, fill #0

## 2023-12-12 MED ORDER — FLUZONE 0.5 ML IM SUSY
0.5000 mL | PREFILLED_SYRINGE | Freq: Once | INTRAMUSCULAR | 0 refills | Status: AC
  Filled 2023-12-12: qty 0.5, 1d supply, fill #0

## 2023-12-15 ENCOUNTER — Other Ambulatory Visit (HOSPITAL_BASED_OUTPATIENT_CLINIC_OR_DEPARTMENT_OTHER): Payer: Self-pay

## 2024-01-17 ENCOUNTER — Emergency Department (HOSPITAL_BASED_OUTPATIENT_CLINIC_OR_DEPARTMENT_OTHER)

## 2024-01-17 ENCOUNTER — Encounter (HOSPITAL_BASED_OUTPATIENT_CLINIC_OR_DEPARTMENT_OTHER): Payer: Self-pay | Admitting: Emergency Medicine

## 2024-01-17 ENCOUNTER — Emergency Department (HOSPITAL_BASED_OUTPATIENT_CLINIC_OR_DEPARTMENT_OTHER)
Admission: EM | Admit: 2024-01-17 | Discharge: 2024-01-17 | Disposition: A | Discharge status: Home / Self Care | Attending: Emergency Medicine | Admitting: Emergency Medicine

## 2024-01-17 ENCOUNTER — Other Ambulatory Visit: Payer: Self-pay

## 2024-01-17 DIAGNOSIS — S20312A Abrasion of left front wall of thorax, initial encounter: Secondary | ICD-10-CM | POA: Diagnosis not present

## 2024-01-17 DIAGNOSIS — Z23 Encounter for immunization: Secondary | ICD-10-CM | POA: Insufficient documentation

## 2024-01-17 DIAGNOSIS — S80212A Abrasion, left knee, initial encounter: Secondary | ICD-10-CM | POA: Insufficient documentation

## 2024-01-17 DIAGNOSIS — S50812A Abrasion of left forearm, initial encounter: Secondary | ICD-10-CM | POA: Diagnosis not present

## 2024-01-17 DIAGNOSIS — S42292A Other displaced fracture of upper end of left humerus, initial encounter for closed fracture: Secondary | ICD-10-CM | POA: Diagnosis not present

## 2024-01-17 DIAGNOSIS — S52501A Unspecified fracture of the lower end of right radius, initial encounter for closed fracture: Secondary | ICD-10-CM | POA: Diagnosis not present

## 2024-01-17 DIAGNOSIS — T148XXA Other injury of unspecified body region, initial encounter: Secondary | ICD-10-CM

## 2024-01-17 DIAGNOSIS — M25512 Pain in left shoulder: Secondary | ICD-10-CM | POA: Diagnosis present

## 2024-01-17 LAB — COMPREHENSIVE METABOLIC PANEL WITH GFR
ALT: 34 U/L (ref 0–44)
AST: 27 U/L (ref 15–41)
Albumin: 4.4 g/dL (ref 3.5–5.0)
Alkaline Phosphatase: 58 U/L (ref 38–126)
Anion gap: 14 (ref 5–15)
BUN: 16 mg/dL (ref 8–23)
CO2: 23 mmol/L (ref 22–32)
Calcium: 9.8 mg/dL (ref 8.9–10.3)
Chloride: 103 mmol/L (ref 98–111)
Creatinine, Ser: 0.99 mg/dL (ref 0.61–1.24)
GFR, Estimated: >60 mL/min (ref >60)
Glucose, Bld: 120 mg/dL — ABNORMAL HIGH (ref 70–99)
Potassium: 4.6 mmol/L (ref 3.5–5.1)
Sodium: 140 mmol/L (ref 135–145)
Total Bilirubin: 0.4 mg/dL (ref 0.0–1.2)
Total Protein: 7.1 g/dL (ref 6.5–8.1)

## 2024-01-17 LAB — CBC WITH DIFFERENTIAL/PLATELET
Abs Immature Granulocytes: 0.14 K/uL — ABNORMAL HIGH (ref 0.00–0.07)
Basophils Absolute: 0.1 K/uL (ref 0.0–0.1)
Basophils Relative: 1 %
Eosinophils Absolute: 0.1 K/uL (ref 0.0–0.5)
Eosinophils Relative: 0 %
HCT: 51.9 % (ref 39.0–52.0)
Hemoglobin: 18.1 g/dL — ABNORMAL HIGH (ref 13.0–17.0)
Immature Granulocytes: 1 %
Lymphocytes Relative: 6 %
Lymphs Abs: 0.8 K/uL (ref 0.7–4.0)
MCH: 33 pg (ref 26.0–34.0)
MCHC: 34.9 g/dL (ref 30.0–36.0)
MCV: 94.5 fL (ref 80.0–100.0)
Monocytes Absolute: 0.7 K/uL (ref 0.1–1.0)
Monocytes Relative: 5 %
Neutro Abs: 12.2 K/uL — ABNORMAL HIGH (ref 1.7–7.7)
Neutrophils Relative %: 87 %
Platelets: 214 K/uL (ref 150–400)
RBC: 5.49 MIL/uL (ref 4.22–5.81)
RDW: 13.1 % (ref 11.5–15.5)
WBC: 14.1 K/uL — ABNORMAL HIGH (ref 4.0–10.5)
nRBC: 0 % (ref 0.0–0.2)

## 2024-01-17 MED ORDER — ONDANSETRON HCL 4 MG/2ML IJ SOLN
4.0000 mg | Freq: Once | INTRAMUSCULAR | Status: AC
  Administered 2024-01-17: 4 mg via INTRAVENOUS
  Filled 2024-01-17: qty 2

## 2024-01-17 MED ORDER — KETOROLAC TROMETHAMINE 30 MG/ML IJ SOLN
30.0000 mg | Freq: Once | INTRAMUSCULAR | Status: AC
  Administered 2024-01-17: 30 mg via INTRAVENOUS
  Filled 2024-01-17: qty 1

## 2024-01-17 MED ORDER — TETANUS-DIPHTH-ACELL PERTUSSIS 5-2-15.5 LF-MCG/0.5 IM SUSP
0.5000 mL | Freq: Once | INTRAMUSCULAR | Status: AC
  Administered 2024-01-17: 0.5 mL via INTRAMUSCULAR
  Filled 2024-01-17: qty 0.5

## 2024-01-17 MED ORDER — OXYCODONE-ACETAMINOPHEN 5-325 MG PO TABS
1.0000 | ORAL_TABLET | ORAL | Status: DC | PRN
  Administered 2024-01-17: 1 via ORAL
  Filled 2024-01-17: qty 1

## 2024-01-17 MED ORDER — LIDOCAINE 5 % EX PTCH: 1.0000 | MEDICATED_PATCH | CUTANEOUS | 0 refills | Status: AC

## 2024-01-17 MED ORDER — METHOCARBAMOL 500 MG PO TABS: 500.0000 mg | ORAL_TABLET | Freq: Two times a day (BID) | ORAL | 0 refills | Status: AC

## 2024-01-17 MED ORDER — HYDROMORPHONE HCL 1 MG/ML IJ SOLN
0.5000 mg | Freq: Once | INTRAMUSCULAR | Status: AC
  Administered 2024-01-17: 0.5 mg via INTRAVENOUS
  Filled 2024-01-17: qty 1

## 2024-01-17 MED ORDER — OXYCODONE-ACETAMINOPHEN 5-325 MG PO TABS: 1.0000 | ORAL_TABLET | Freq: Four times a day (QID) | ORAL | 0 refills | Status: AC | PRN

## 2024-01-17 MED ORDER — HYDROMORPHONE HCL 1 MG/ML IJ SOLN
1.0000 mg | Freq: Once | INTRAMUSCULAR | Status: AC
  Administered 2024-01-17: 1 mg via INTRAVENOUS
  Filled 2024-01-17: qty 1

## 2024-01-17 MED ORDER — IOHEXOL 300 MG/ML  SOLN
100.0000 mL | Freq: Once | INTRAMUSCULAR | Status: AC | PRN
  Administered 2024-01-17: 100 mL via INTRAVENOUS

## 2024-01-17 MED ORDER — BACITRACIN ZINC 500 UNIT/GM EX OINT: TOPICAL_OINTMENT | Freq: Two times a day (BID) | CUTANEOUS | Status: DC
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
## 2024-01-17 NOTE — ED Notes (Signed)
 Patient transported to CT
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
## 2024-01-19 ENCOUNTER — Other Ambulatory Visit: Payer: Self-pay | Admitting: Orthopedic Surgery

## 2024-01-19 DIAGNOSIS — S52512A Displaced fracture of left radial styloid process, initial encounter for closed fracture: Secondary | ICD-10-CM

## 2024-01-20 ENCOUNTER — Other Ambulatory Visit: Payer: Self-pay | Admitting: Orthopedic Surgery

## 2024-01-20 ENCOUNTER — Ambulatory Visit
Admission: RE | Admit: 2024-01-20 | Discharge: 2024-01-20 | Disposition: A | Discharge status: Home / Self Care | Source: Ambulatory Visit | Attending: Orthopedic Surgery | Admitting: Orthopedic Surgery

## 2024-01-20 DIAGNOSIS — S52512A Displaced fracture of left radial styloid process, initial encounter for closed fracture: Secondary | ICD-10-CM

## 2024-01-26 ENCOUNTER — Other Ambulatory Visit: Payer: Self-pay | Admitting: Orthopedic Surgery

## 2024-01-26 DIAGNOSIS — S52511A Displaced fracture of right radial styloid process, initial encounter for closed fracture: Secondary | ICD-10-CM

## 2024-02-04 ENCOUNTER — Inpatient Hospital Stay
Admission: RE | Admit: 2024-02-04 | Discharge: 2024-02-04 | Attending: Orthopedic Surgery | Admitting: Orthopedic Surgery

## 2024-02-04 DIAGNOSIS — S52511A Displaced fracture of right radial styloid process, initial encounter for closed fracture: Secondary | ICD-10-CM
